# Patient Record
Sex: Male | Born: 1975 | Race: White | Hispanic: No | Marital: Married | Smoking: Never smoker
Health system: Southern US, Community
[De-identification: ages and names within clinical notes are randomized; demographics above are authoritative.]

## PROBLEM LIST (undated history)

## (undated) DIAGNOSIS — K760 Fatty (change of) liver, not elsewhere classified: Secondary | ICD-10-CM

## (undated) DIAGNOSIS — E663 Overweight: Secondary | ICD-10-CM

## (undated) DIAGNOSIS — F988 Other specified behavioral and emotional disorders with onset usually occurring in childhood and adolescence: Secondary | ICD-10-CM

## (undated) DIAGNOSIS — H9190 Unspecified hearing loss, unspecified ear: Secondary | ICD-10-CM

## (undated) HISTORY — DX: Other specified behavioral and emotional disorders with onset usually occurring in childhood and adolescence: F98.8

## (undated) HISTORY — DX: Unspecified hearing loss, unspecified ear: H91.90

## (undated) HISTORY — DX: Overweight: E66.3

## (undated) HISTORY — DX: Fatty (change of) liver, not elsewhere classified: K76.0

---

## 2006-01-01 ENCOUNTER — Ambulatory Visit: Payer: Self-pay | Admitting: Internal Medicine

## 2006-01-07 ENCOUNTER — Ambulatory Visit: Payer: Self-pay | Admitting: Internal Medicine

## 2006-02-07 ENCOUNTER — Ambulatory Visit: Payer: Self-pay | Admitting: Internal Medicine

## 2006-02-12 ENCOUNTER — Ambulatory Visit: Payer: Self-pay | Admitting: Internal Medicine

## 2006-12-15 ENCOUNTER — Ambulatory Visit: Payer: Self-pay | Admitting: Internal Medicine

## 2007-05-26 ENCOUNTER — Ambulatory Visit: Payer: Self-pay | Admitting: Internal Medicine

## 2007-11-12 HISTORY — PX: HAIR TRANSPLANT: SHX1719

## 2009-04-29 ENCOUNTER — Emergency Department (HOSPITAL_BASED_OUTPATIENT_CLINIC_OR_DEPARTMENT_OTHER): Admission: EM | Admit: 2009-04-29 | Discharge: 2009-04-29 | Payer: Self-pay | Admitting: Emergency Medicine

## 2010-05-28 ENCOUNTER — Encounter (INDEPENDENT_AMBULATORY_CARE_PROVIDER_SITE_OTHER): Payer: Self-pay | Admitting: *Deleted

## 2010-05-28 ENCOUNTER — Ambulatory Visit: Payer: Self-pay | Admitting: Family

## 2010-05-28 DIAGNOSIS — R197 Diarrhea, unspecified: Secondary | ICD-10-CM | POA: Insufficient documentation

## 2010-05-28 DIAGNOSIS — J301 Allergic rhinitis due to pollen: Secondary | ICD-10-CM | POA: Insufficient documentation

## 2010-05-28 DIAGNOSIS — S058X9A Other injuries of unspecified eye and orbit, initial encounter: Secondary | ICD-10-CM | POA: Insufficient documentation

## 2010-05-28 DIAGNOSIS — I10 Essential (primary) hypertension: Secondary | ICD-10-CM | POA: Insufficient documentation

## 2010-05-28 DIAGNOSIS — R61 Generalized hyperhidrosis: Secondary | ICD-10-CM | POA: Insufficient documentation

## 2010-05-29 ENCOUNTER — Encounter (INDEPENDENT_AMBULATORY_CARE_PROVIDER_SITE_OTHER): Payer: Self-pay | Admitting: *Deleted

## 2010-05-30 LAB — CONVERTED CEMR LAB
ALT: 51 units/L (ref 0–53)
BUN: 12 mg/dL (ref 6–23)
Basophils Relative: 1 % (ref 0–1)
Bilirubin, Direct: 0.1 mg/dL (ref 0.0–0.3)
Chloride: 102 meq/L (ref 96–112)
Creatinine, Ser: 0.9 mg/dL (ref 0.40–1.50)
HCT: 47.6 % (ref 39.0–52.0)
Hemoglobin: 16.1 g/dL (ref 13.0–17.0)
MCHC: 33.8 g/dL (ref 30.0–36.0)
MCV: 87.8 fL (ref 78.0–100.0)
Monocytes Absolute: 0.3 10*3/uL (ref 0.1–1.0)
Monocytes Relative: 5 % (ref 3–12)
Neutro Abs: 3.3 10*3/uL (ref 1.7–7.7)
RBC: 5.42 M/uL (ref 4.22–5.81)
TSH: 3.58 microintl units/mL (ref 0.350–4.500)
Total Protein: 7.8 g/dL (ref 6.0–8.3)

## 2010-05-31 ENCOUNTER — Encounter: Payer: Self-pay | Admitting: Family

## 2010-06-11 ENCOUNTER — Encounter: Payer: Self-pay | Admitting: Family

## 2010-07-13 ENCOUNTER — Ambulatory Visit: Payer: Self-pay | Admitting: Family

## 2010-07-13 DIAGNOSIS — E669 Obesity, unspecified: Secondary | ICD-10-CM | POA: Insufficient documentation

## 2010-07-13 LAB — CONVERTED CEMR LAB: HDL: 29 mg/dL — ABNORMAL LOW (ref >39)

## 2010-07-19 ENCOUNTER — Telehealth: Payer: Self-pay | Admitting: Family

## 2010-07-19 DIAGNOSIS — E781 Pure hyperglyceridemia: Secondary | ICD-10-CM | POA: Insufficient documentation

## 2010-08-10 ENCOUNTER — Telehealth: Payer: Self-pay | Admitting: Family

## 2010-08-10 ENCOUNTER — Ambulatory Visit: Payer: Self-pay | Admitting: Family

## 2010-08-10 DIAGNOSIS — F988 Other specified behavioral and emotional disorders with onset usually occurring in childhood and adolescence: Secondary | ICD-10-CM | POA: Insufficient documentation

## 2010-08-27 ENCOUNTER — Telehealth: Payer: Self-pay | Admitting: Family

## 2010-08-27 ENCOUNTER — Ambulatory Visit: Payer: Self-pay | Admitting: Family

## 2010-09-06 ENCOUNTER — Encounter: Payer: Self-pay | Admitting: Family

## 2010-09-06 ENCOUNTER — Ambulatory Visit: Payer: Self-pay | Admitting: Family

## 2010-09-07 ENCOUNTER — Telehealth: Payer: Self-pay | Admitting: Family

## 2010-09-10 ENCOUNTER — Encounter: Payer: Self-pay | Admitting: Family

## 2010-09-10 LAB — CONVERTED CEMR LAB
CO2: 26 meq/L (ref 19–32)
Chloride: 102 meq/L (ref 96–112)
Glucose, Bld: 103 mg/dL — ABNORMAL HIGH (ref 70–99)
Potassium: 4.6 meq/L (ref 3.5–5.3)
Sodium: 140 meq/L (ref 135–145)

## 2010-09-11 ENCOUNTER — Encounter: Payer: Self-pay | Admitting: Family

## 2010-11-26 ENCOUNTER — Ambulatory Visit: Admit: 2010-11-26 | Payer: Self-pay | Admitting: Family

## 2010-12-11 NOTE — Progress Notes (Signed)
Summary: BMP time frame  Phone Note Outgoing Call   Summary of Call: Please call patient and let him know that his BP looks better.  He should continue current dosage of lisinopril.  I would like for him to have a BMET checked in the next few weeks. (Diagnosis HTN)  I believe that his insurance is going to be running out so we can plan to check before then.  Also, if he completes his BMET then I do not need to see him back until january.  He can cancel december apt. thanks Initial call taken by: Lemont Fillers FNP,  September 07, 2010 3:29 PM  Follow-up for Phone Call        Pt states Monday, 10/31 is last day of insurance. Is that too soon to check BMP? Nicki Guadalajara Fergerson CMA Duncan Dull)  September 07, 2010 3:57 PM   Additional Follow-up for Phone Call Additional follow up Details #1::        Monday is fine. Additional Follow-up by: Lemont Fillers FNP,  September 07, 2010 4:08 PM    Additional Follow-up for Phone Call Additional follow up Details #2::    Left detailed message per [facility] instruction. Advised pt to call me back to cancel December appt and schedule f/u in January. Lab order placed and faxed to lab. Lab hours left for pt on voicemail. Nicki Guadalajara Fergerson CMA Duncan Dull)  September 07, 2010 4:48 PM

## 2010-12-11 NOTE — Letter (Signed)
Summary: Comprehensive Eye Exam/[facility]  Comprehensive Eye Exam/ [facility]   Imported By: Maryln Gottron 06/19/2010 10:48:13  _____________________________________________________________________  External Attachment:    Type:   Image     Comment:   External Document

## 2010-12-11 NOTE — Progress Notes (Signed)
Summary: lab results  Phone Note Outgoing Call   Summary of Call: Please call patient and let him know that his triglycerides are very high.  I would like him to add OTC fish oil and make the following dietary changes:   1.  Avoid white bread, white pasta and white rice 2.  Avoid high fructose corn syrup 3.  Instead eat brown carbs- Yams, Wheat pasta, whole grained breads, wild rice.  Return in 3 months for a follow up fasting lipid profile-  if no improvement at that time we may need to consider a prescription med for his triglycerides.  Initial call taken by: Lemont Fillers FNP,  July 19, 2010 2:41 PM  Follow-up for Phone Call        Left message on machine to return my call. Nicki Guadalajara Fergerson CMA Duncan Dull)  July 19, 2010 3:18 PM   Additional Follow-up for Phone Call Additional follow up Details #1::        Pt returned my call and was notified of lab results. Pt already has f/u on 10/19/10 and will do labs then. Pt is aware to be fasting. Nicki Guadalajara Fergerson CMA Duncan Dull)  July 19, 2010 3:43 PM   New Problems: HYPERTRIGLYCERIDEMIA (ICD-272.1)   New Problems: HYPERTRIGLYCERIDEMIA (ICD-272.1) New/Updated Medications: FISH OIL 1000 MG CAPS (OMEGA-3 FATTY ACIDS) 2 caps by mouth two times a day

## 2010-12-11 NOTE — Letter (Signed)
   [place] at [facility] [address] [place], [place]  [address]  [place] Phone: [phone]      September 11, 2010   [facility] [address] [place], [place] [address]  RE:  LAB RESULTS  Dear  Marcus Arias,  The following is an interpretation of your most recent lab tests.  Please take note of any instructions provided or changes to medications that have resulted from your lab work.  Hemoccult Test for blood in stool:  negative  ELECTROLYTES:  Good - no changes needed  KIDNEY FUNCTION TESTS:  Good - no changes needed   Your sugar was slightly elevated, not into a diabetic range. Please continue your weight loss and exercise efforts.  We will plan to repeat your glucose in 3-6 months.   Sincerely Yours,    Lemont Fillers FNP  Appended Document:  Mailed.

## 2010-12-11 NOTE — Assessment & Plan Note (Signed)
Summary: 2 MONTH FOLLOW UP/MHF--Rm 4   Vital Signs:  Patient profile:   35 year old male Height:      70.5 inches Weight:      247.25 pounds BMI:     35.10 Temp:     97.7 degrees F oral Pulse rate:   84 / minute Pulse rhythm:   regular Resp:     16 per minute BP sitting:   120 / 88  (right arm) Cuff size:   large  Vitals Entered By: Mervin Kung CMA Duncan Dull) (July 13, 2010 8:07 AM) CC: Room 4  2 month follow up.  Is Patient Diabetic? No Pain Assessment Patient in pain? yes     Location: abdomen Comments Pt thinks he ate something bad last night and is having some abdominal pain and nausea. Nicki Guadalajara Fergerson CMA (AAMA)  July 13, 2010 8:12 AM    CC:  Room 4  2 month follow up. Marland Kitchen  History of Present Illness: Mr Disney is a 35 year old male who presents today for follow up of his previously elevated blood pressure.  Last visit he was noted to have a BP 138/100.  Since that time he has changed his diet and has been incorporating more organic foods as well as watching his sodium.  His weight has remained essentially unchanged.    Diarrhea- Patient has cancelled his apt with GI- notes some improvement in the consistency of his BM's since he has made dietary changes.   No BRBPR.  Does note that he has had some mild nausea this AM which he attributes to food he ate last night, no vomitting.     Allergies (verified): No Known Drug Allergies  Physical Exam  General:  Overweight, pleasant white male in NAD Head:  Normocephalic and atraumatic without obvious abnormalities. No apparent alopecia or balding. Lungs:  Normal respiratory effort, chest expands symmetrically. Lungs are clear to auscultation, no crackles or wheezes. Heart:  Normal rate and regular rhythm. S1 and S2 normal without gallop, murmur, click, rub or other extra sounds. Abdomen:  Bowel sounds positive,abdomen soft and non-tender without masses, organomegaly or hernias noted.   Impression &  Recommendations:  Problem # 1:  HYPERTENSION, MILD (ICD-401.1) Assessment Improved BP is improved,  Will continue to monitor off meds, continue low sodium diet.   BP today: 120/88 Prior BP: 138/100 (05/28/2010)  Labs Reviewed: K+: 4.4 (05/28/2010) Creat: : 0.90 (05/28/2010)     Problem # 2:  DIARRHEA, CHRONIC (ICD-787.91) Assessment: Improved Declines GI referral at this time.  Will monitor.    Complete Medication List: 1)  Claritin 10 Mg Tabs (Loratadine) .... One tablet by mouth daily as needed  Other Orders: TLB-Lipid Panel (80061-LIPID)  Patient Instructions: 1)  Try to keep your calories to 1800- 2000 a day.   2)  Good job with with the blood pressure.  3)  Goal blood pressure is less than 140/90. 4)  Try to exercise at least 3 times a week. 5)  Follow up in 3 months.  Sooner if problems or concerns.  Current Allergies (reviewed today): No known allergies

## 2010-12-11 NOTE — Assessment & Plan Note (Signed)
Summary: nurse visit  Nurse Visit   Vital Signs:  Patient profile:   35 year old male Pulse rate:   90 / minute Pulse rhythm:   regular Resp:     16 per minute BP sitting:   120 / 90  (right arm) Cuff size:   large  Vitals Entered By: Mervin Kung CMA Duncan Dull) (September 06, 2010 8:38 AM) CC: Pt here for nurse visit for blood pressure check. Pt states he is taking BP med daily. Has appointments with [place] in December and January. Does he need to cancel one of these? Is Patient Diabetic? No Pain Assessment Patient in pain? no        Impression & Recommendations:  Problem # 1:  HYPERTENSION, MILD (ICD-401.1) Assessment Improved BP is improved.  Plan to continue lisinopril.  Pt will need a follow up BMET performed.  His updated medication list for this problem includes:    Lisinopril 10 Mg Tabs (Lisinopril) ..... One tablet by mouth once daily  BP today: 120/90 Prior BP: 130/100 (08/27/2010)  Labs Reviewed: K+: 4.4 (05/28/2010) Creat: : 0.90 (05/28/2010)   Chol: 208 (07/13/2010)   HDL: 29 (07/13/2010)   LDL: See Comment mg/dL (08/65/7846)   TG: 962 (07/13/2010)  Complete Medication List: 1)  Claritin 10 Mg Tabs (Loratadine) .... One tablet by mouth daily as needed 2)  Fish Oil 1000 Mg Caps (Omega-3 fatty acids) .... 2 caps by mouth two times a day 3)  Advil 200 Mg Tabs (Ibuprofen) .... As needed. 4)  Strattera 40 Mg Caps (Atomoxetine hcl) .... One cap by mouth once daily 5)  Lisinopril 10 Mg Tabs (Lisinopril) .... One tablet by mouth once daily   Allergies (verified): No Known Drug Allergies  Orders Added: 1)  Est. Patient Level I [95284]  Current Allergies (reviewed today): No known allergies

## 2010-12-11 NOTE — Assessment & Plan Note (Signed)
Summary: add management/mhf--Rm 5   Vital Signs:  Patient profile:   35 year old male Height:      70.5 inches Weight:      252.50 pounds BMI:     35.85 Temp:     98.0 degrees F oral Pulse rate:   96 / minute Pulse rhythm:   regular Resp:     16 per minute BP sitting:   126 / 86  (right arm) Cuff size:   large  Vitals Entered By: Mervin Kung CMA Duncan Dull) (August 10, 2010 9:50 AM) CC: rm 5  Pt states he has history of ADD and would like to go back on meds. Is Patient Diabetic? No Pain Assessment Patient in pain? no        CC:  rm 5  Pt states he has history of ADD and would like to go back on meds..  History of Present Illness: Marcus Arias is a 35 year old male who presents today with complaint of difficulty focusing,  having trouble paying attention in class if there are also  "side conversations."  Recently enrolled in a Bachelors program.  Notes that his mind wanders when he studies.  Diagnosed with ADD in elementary school- he took Ridilin at that time. In his 20's he was treated with Strattera. While on Strattera he believes that he was better able to focus on conversation.  He was previously being treated by a Family physician. He does not believe that he underwent formal evaluation for his ADD.    Allergies (verified): No Known Drug Allergies  Past History:  Past Medical History: overweight Notes hearing issues as a baby- ? tubes as baby.  ADD  Review of Systems       patient notes resolution of his diarrhea since he started an organic diet.  Physical Exam  General:  Well-developed,well-nourished,in no acute distress; alert,appropriate and cooperative throughout examination Psych:  Cognition and judgment appear intact. Alert and cooperative with normal attention span and concentration. No apparent delusions, illusions, hallucinations   Impression & Recommendations:  Problem # 1:  ADD (ICD-314.00) Assessment Deteriorated Patient wishes to resume  Strattera.  I did recommend to the patient that he undergo a formal evaluation for ADD at [facility] which he ultimately has declined to do.  Will plan to resume strattera and have him follow up in 1 month for BP check.  15 minutes were spent with patient.  Greater than 50% of this time was spent counseling patient on his ADD.  Complete Medication List: 1)  Claritin 10 Mg Tabs (Loratadine) .... One tablet by mouth daily as needed 2)  Fish Oil 1000 Mg Caps (Omega-3 fatty acids) .... 2 caps by mouth two times a day 3)  Advil 200 Mg Tabs (Ibuprofen) .... As needed. 4)  Strattera 80 Mg Caps (Atomoxetine hcl) .... 1/2 tablet by mouth once daily for 3 days, then increase to one full tablet once daily  Patient Instructions: 1)  You will be called about your referral to [facility]. 2)  Follow up 12/9 at 8 AM.  Current Allergies (reviewed today): No known allergies

## 2010-12-11 NOTE — Progress Notes (Signed)
Summary: Generic ADD med?  Phone Note Call from Patient Call back at [phone] ok to leave detailed message.   Caller: Patient Call For: Lemont Fillers FNP Summary of Call: Pt called and asked to be placed on a generic med for ADD so he will be able to afford it. Requested generic Ritalin. Please advise. Initial call taken by: Mervin Kung CMA Duncan Dull),  August 27, 2010 12:17 PM  Follow-up for Phone Call        Left message on patient's voicemail re:  generic adderall.  His blood pressure will need to be better controlled before we can consider switching his medication.  If we do switch him down the road, he will need to be evaluated more frequently for BP checks. Follow-up by: Lemont Fillers FNP,  August 27, 2010 1:43 PM     Appended Document: Generic ADD med? Spoke to [facility] and changed quantity to #90 x no refills, ok per Melissa. Pt aware.

## 2010-12-11 NOTE — Progress Notes (Signed)
Summary: Refusal of referral  Phone Note Call from Patient Call back at [phone]   Caller: Patient Details for Reason: Referral Summary of Call: Please call pt    [phone]    "I don't want to be referred to any doctor,before talking to [facility]"   Initial call taken by: Darral Dash,  August 10, 2010 11:00 AM  Follow-up for Phone Call        Pt left voice message stating that [facility] is non-preferred with his insurance and he doesn't want to be referred to anyone for ADD evaluation. He feels this is a waste of his time and money as he was diagnosed with this in the past and feels he just needs to go back on his medication Blase Mess).  Pt feels like he doesn't need an evaluation to be told what he already knows.  Please advise.  Nicki Guadalajara Fergerson CMA Duncan Dull)  August 10, 2010 12:09 PM   Additional Follow-up for Phone Call Additional follow up Details #1::        Rx sent to pharmacy.  Pt should call if he develops symptoms or depression or suicidal thoughts after starting this medication.  He should follow up in 1 month so that we can see how he is doing and repeat his blood pressure on this medication. Additional Follow-up by: Lemont Fillers FNP,  August 10, 2010 12:16 PM    Additional Follow-up for Phone Call Additional follow up Details #2::    Left message on machine to return my call. Nicki Guadalajara Fergerson CMA Duncan Dull)  August 10, 2010 2:04 PM   Pt left message returning my call. Attempted to reach pt and received voice mail. Left detailed message on phone re: Alleene Stoy's instructions and to call and scheduled an appt in 1 month.   Nicki Guadalajara Fergerson CMA Duncan Dull)  August 13, 2010 9:11 AM   New/Updated Medications: STRATTERA 80 MG CAPS (ATOMOXETINE HCL) 1/2 tablet by mouth once daily for 3 days, then increase to one full tablet once daily Prescriptions: STRATTERA 80 MG CAPS (ATOMOXETINE HCL) 1/2 tablet by mouth once daily for 3 days, then increase to one full tablet once daily   #30 x 1   Entered and Authorized by:   Lemont Fillers FNP   Signed by:   Lemont Fillers FNP on 08/10/2010   Method used:   Electronically to        [facility]* (retail)       [address]       [place], [place]  [id number]       Ph: [phone]       Fax: [phone]   RxID:   [id number]   Appended Document: Refusal of referral Pt returned my call. Instructions given to pt and he voice understanding. Scheduled f/u for 09/12/10 @ 8:15 with Keva Darty.

## 2010-12-11 NOTE — Assessment & Plan Note (Signed)
Summary: 1 month f/u /tf,cma rsch per pt/dt--Rm 5   Vital Signs:  Patient profile:   35 year old male Height:      70.5 inches Weight:      249.25 pounds BMI:     35.39 Temp:     98.0 degrees F oral Pulse rate:   90 / minute Pulse rhythm:   regular Resp:     16 per minute BP sitting:   130 / 100  (right arm) Cuff size:   large  Vitals Entered By: Mervin Kung CMA Duncan Dull) (August 27, 2010 10:50 AM) CC: Rm 5  Follow up of ADD. Is Patient Diabetic? No Pain Assessment Patient in pain? no      Comments Pt agrees all med doses and directions are correct. Nicki Guadalajara Fergerson CMA Duncan Dull)  August 27, 2010 10:55 AM    CC:  Rm 5  Follow up of ADD.Marland Kitchen  History of Present Illness: Marcus Arias is a 35 year old male who presents today for follow up.    1. ADD.  Last month he was started back on Strattera, which he had been off of for many years.  Notes that he is better able to focus since starting the strattera.  He notes that he was too somnolent on Strattera 80, he is better able to tolerate strattera 40 and feels that this dose is effective for him.      2. High blood pressure- notes that he has restarted an exercise routine which he is enjoying.  Has not complied completely with low sodium diet. He has lost several pounds since his last visit.  Unfortunately, the patient has recently lost his job and is scheduled to lose his health insurance at the end of the month.   Allergies (verified): No Known Drug Allergies  Past History:  Past Medical History: Last updated: 08/10/2010 overweight Notes hearing issues as a baby- ? tubes as baby.  ADD  Past Surgical History: Last updated: 05/28/2010 Hair transplantation procedure 2009  Physical Exam  General:  Well-developed,well-nourished,in no acute distress; alert,appropriate and cooperative throughout examination Psych:  Cognition and judgment appear intact. Alert and cooperative with normal attention span and concentration. No  apparent delusions, illusions, hallucinations   Impression & Recommendations:  Problem # 1:  ADD (ICD-314.00) Assessment Improved Symptoms are improved, continue strattera at 40mg .  15 minutes were spent with patient.  Greater than 50% of this time was spent counseling the patient on his ADD.  Problem # 2:  HYPERTENSION, MILD (ICD-401.1) Assessment: Deteriorated Will add lisinopril  Pt was instructed to book a nurse visit in 1 week for BP check.  We will also plan to check his electrolytes at that visit.  His updated medication list for this problem includes:    Lisinopril 10 Mg Tabs (Lisinopril) ..... One tablet by mouth once daily  BP today: 130/100 Prior BP: 126/86 (08/10/2010)  Labs Reviewed: K+: 4.4 (05/28/2010) Creat: : 0.90 (05/28/2010)   Chol: 208 (07/13/2010)   HDL: 29 (07/13/2010)   LDL: See Comment mg/dL (29/56/2130)   TG: 865 (07/13/2010)  Complete Medication List: 1)  Claritin 10 Mg Tabs (Loratadine) .... One tablet by mouth daily as needed 2)  Fish Oil 1000 Mg Caps (Omega-3 fatty acids) .... 2 caps by mouth two times a day 3)  Advil 200 Mg Tabs (Ibuprofen) .... As needed. 4)  Strattera 40 Mg Caps (Atomoxetine hcl) .... One cap by mouth once daily 5)  Lisinopril 10 Mg Tabs (Lisinopril) .... One tablet  by mouth once daily  Patient Instructions: 1)  Please schedule a follow-up appointment in 3 months- come fasting to this appointment. 2)  Follow up in 1 week for lab work and a nurse visit. Prescriptions: LISINOPRIL 10 MG TABS (LISINOPRIL) one tablet by mouth once daily  #30 x 2   Entered and Authorized by:   Lemont Fillers FNP   Signed by:   Lemont Fillers FNP on 08/27/2010   Method used:   Electronically to        [facility] [phone]* (retail)       [address] [place], [place]  [address]       Ph: [phone]       Fax: [phone]   RxID:   [id number] STRATTERA 40 MG CAPS (ATOMOXETINE HCL) one cap by mouth once daily  #30 x 2    Entered and Authorized by:   Lemont Fillers FNP   Signed by:   Lemont Fillers FNP on 08/27/2010   Method used:   Electronically to        [facility]* (retail)       [address]       [place], [place]  [id number]       Ph: [phone]       Fax: [phone]   RxID:   [id number]    Orders Added: 1)  Est. Patient Level III [[address]]    Current Allergies (reviewed today): No known allergies

## 2010-12-11 NOTE — Letter (Signed)
Summary: Primary Care Consult Scheduled Letter  [place] [facility]  [address]   [place], [place] [address]   Phone: [phone]  Fax: [phone]      05/29/2010 MRN: [id number]  [facility] [address] [place], [place]  [address]    Dear Mr. Gerwig,      We have scheduled an appointment for you.  At the recommendation of MELISSA O'SULLIVAN, FNP , we have scheduled you a consult with [facility]O, DR LONG on AUGUST 15,2011 at 1:30PM.  Their address is_[address],[place]  . The office phone number is [phone].  If this appointment day and time is not convenient for you, please feel free to call the office of the doctor you are being referred to at the number listed above and reschedule the appointment.     It is important for you to keep your scheduled appointments. We are here to make sure you are given good patient care.     Thank you,  Darral Dash Patient Care Coordinator [place] [facility]

## 2010-12-11 NOTE — Letter (Signed)
   [place] at [facility] [address] [place], [place]  [address]  [place] Phone: [phone]      May 31, 2010   Marcus Arias [address] [place], [place] [address]  RE:  LAB RESULTS  Dear  Marcus Arias,  The following is an interpretation of your most recent lab tests.  Please take note of any instructions provided or changes to medications that have resulted from your lab work.  ELECTROLYTES:  Good - no changes needed  KIDNEY FUNCTION TESTS:  Good - no changes needed  LIVER FUNCTION TESTS:  Good - no changes needed  THYROID STUDIES:  Thyroid studies normal TSH: 3.580     DIABETIC STUDIES:  Good - no changes needed Blood Glucose: 95   HIV testing is negative.   Sincerely Yours,    Lemont Fillers FNP  Appended Document:  Mailed.

## 2010-12-11 NOTE — Letter (Signed)
Summary: Primary Care Consult Scheduled Letter  [place] at [facility]  [address]   [place], [place] [address]   Phone: [phone]  Fax: [phone]      05/28/2010 MRN: [id number]  Marcus Arias [facility] [address] [place], [place]  [address]    Dear Marcus Arias,      We have scheduled an appointment for you.  At the recommendation of MELISSA O'SULLIVAN, FNP, we have scheduled you a consult with [facility]  DR Jeanella Anton  on _JULY 22 ,2011 at _2PM.  Their address is__[address], [place] . The office phone number is [phone].  If this appointment day and time is not convenient for you, please feel free to call the office of the doctor you are being referred to at the number listed above and reschedule the appointment.     It is important for you to keep your scheduled appointments. We are here to make sure you are given good patient care.   Thank you,  Darral Dash Patient Care Coordinator  at [facility]

## 2010-12-11 NOTE — Assessment & Plan Note (Signed)
Summary: TO RE-EST  Marcus Arias HAS WAX IN EAR  /HEA--rm 5   Vital Signs:  Patient profile:   35 year old male Height:      70.5 inches Weight:      247.50 pounds BMI:     35.14 Temp:     97.5 degrees F oral Pulse rate:   90 / minute Pulse rhythm:   regular Resp:     18 per minute BP sitting:   138 / 100  (right arm) Cuff size:   large  Vitals Entered By: Mervin Kung CMA Duncan Dull) (May 28, 2010 11:33 AM) CC: rOOM 5   Pt wants physical. Has wax build up in left ear. Left eye blurry x  1 year ago after scratch on pt's cornea. Having night sweats.   CC:  rOOM 5   Pt wants physical. Has wax build up in left ear. Left eye blurry x  1 year ago after scratch on pt's cornea. Having night sweats..  History of Present Illness: Mr Marcus Arias is a 35 year old male who presents today to re-establish care.  He would like to have a complete physical and also has several concerns that he would like to address.   1) Corneal Scratch-Notes corneal scratch 1 year ago- got a particle in his eye due to wind.  Saw MD at [facility].  Since that time he feels that he is not focusing as well out of his left eye.  2)Night sweats- x 6 months.  Denies known fevers.  Denies cough.    3) Chronic Diarrhea-  notes that this started about 1 year ago.  He also reports + hemorrhoid.  Notes that he had some blood noted on tissue 2-3 months ago.  Patient reports that he has 1-2 loose stools a day.    4) L. ear pain-  notes that left ear sounds muffled.  He saw audiologist and was told to return after wax was removed.  5) High blood pressure-  Has never been on BP meds.    6) Preventative care- reports + regular exercise.  Wife is pre-diabetic.  They have been eating much healthier.  He believes that his last tetanus was in 2008.  Past History:  Past Medical History: overweight Notes hearing issues as a baby- ? tubes as baby.   Past Surgical History: Hair transplantation procedure 2009  Family History: Mom- living,  alive and well Dad- skin cancer, fibromyalgia, OA, chronic fatigue brother- history of "blood in stool" sister-alive and well No children  Social History: works as an Film/video editor Married No children Never smoked rare social ETOH Denies drug use + exercise- swimming 3x a week.  Review of Systems       Constitutional: Denies Fever ENT:  + nasal congestion due to allergies or sore throat. Resp: Denies cough CV:  Denies Chest Pain or SOB GI:  Denies nausea or vomitting, + diarrhea, denies melena GU: Denies dysuria Lymphatic: Denies lymphadenopathy Musculoskeletal:  Denies muscle/joint pain Skin:  Denies Rashes Psychiatric: history of depression- situational depression. Notes + OCD behavior- does not interfere with day to day activities.   Neuro: Denies numbness      Physical Exam  General:  Overweight white male in NAd Head:  Normocephalic and atraumatic without obvious abnormalities. No apparent alopecia or balding. Eyes:  PERRLA Ears:  Noted to have mild serous effusions bilaterally. Hearing is grossly normal bilaterally. Mouth:  Oral mucosa and oropharynx without lesions or exudates.  Teeth in good  repair. Neck:  No deformities, masses, or tenderness noted. Chest Wall:  No deformities, masses, tenderness or gynecomastia noted. Lungs:  Normal respiratory effort, chest expands symmetrically. Lungs are clear to auscultation, no crackles or wheezes. Heart:  Normal rate and regular rhythm. S1 and S2 normal without gallop, murmur, click, rub or other extra sounds. Abdomen:  Bowel sounds positive,abdomen soft and non-tender without masses, organomegaly or hernias noted. Rectal:  No external abnormalities noted. Normal sphincter tone. No rectal masses or tenderness. Heme negative Genitalia:  Testes bilaterally descended without nodularity, tenderness or masses. No scrotal masses or lesions. No penis lesions or urethral discharge. Prostate:  Prostate gland firm  and smooth, no enlargement, nodularity, tenderness, mass, asymmetry or induration. Msk:  No deformity or scoliosis noted of thoracic or lumbar spine.   Extremities:  No clubbing, cyanosis, edema, or deformity noted with normal full range of motion of all joints.   Neurologic:  No cranial nerve deficits noted. Station and gait are normal. Plantar reflexes are down-going bilaterally. DTRs are symmetrical throughout. Sensory, motor and coordinative functions appear intact. Skin:  Intact without suspicious lesions or rashes Cervical Nodes:  No lymphadenopathy noted Psych:  Cognition and judgment appear intact. Alert and cooperative with normal attention span and concentration. No apparent delusions, illusions, hallucinations   Impression & Recommendations:  Problem # 1:  Preventive Health Care (ICD-V70.0) Assessment Comment Only Pt believes that he had tetanus in 2008 at a family practice center.  Exercises regularly.  Encouraged to continue his healthy diet, exercise and weight loss efforts.  Will check baseline labs.  Problem # 2:  HYPERTENSION, MILD (ICD-401.1) Assessment: New Pt counseled on low sodium diet.  Plan to repeat in 1 month.  If still elevated will plan to initiate meds.  Problem # 3:  NIGHT SWEATS (ICD-780.8) Assessment: New Check TSH, CBC,  will also check HIV screen  Problem # 4:  DIARRHEA, CHRONIC (ICD-787.91) Assessment: Deteriorated  ? IBS, has been ongoing for 1 year.  Will plan referral to GI.    Orders: Gastroenterology Referral (GI)  Problem # 5:  ALLERGIC RHINITIS, SEASONAL (ICD-477.0) Will add claritin once a day, may also help with ear congestion.  No clincal sign of ear infection  Problem # 6:  SUPERFICIAL INJURY OF CORNEA (ICD-918.1) Assessment: Comment Only Will plan referral to opthalmology for further evaluation. Orders: Ophthalmology Referral (Ophthalmology)  Complete Medication List: 1)  Claritin 10 Mg Tabs (Loratadine) .... One tablet by mouth  daily  Other Orders: TLB-BMP (Basic Metabolic Panel-BMET) (80048-METABOL) TLB-CBC Platelet - w/Differential (85025-CBCD) TLB-Hepatic/Liver Function Pnl (80076-HEPATIC) TLB-TSH (Thyroid Stimulating Hormone) (84443-TSH) T-HIV-1 (Screen) (08657)  Patient Instructions: 1)  Please complete your labs downstairs today. 2)  Follow up in 1 month. 3)  You will be contacted about your referrals to GI and Opthalmology.

## 2011-03-05 ENCOUNTER — Encounter: Payer: Self-pay | Admitting: Family

## 2011-03-06 ENCOUNTER — Ambulatory Visit (INDEPENDENT_AMBULATORY_CARE_PROVIDER_SITE_OTHER): Payer: 59 | Admitting: Family

## 2011-03-06 ENCOUNTER — Encounter: Payer: Self-pay | Admitting: Family

## 2011-03-06 VITALS — BP 120/90 | HR 78 | Temp 98.0°F | Resp 16 | Ht 70.51 in | Wt 237.1 lb

## 2011-03-06 DIAGNOSIS — F988 Other specified behavioral and emotional disorders with onset usually occurring in childhood and adolescence: Secondary | ICD-10-CM

## 2011-03-06 DIAGNOSIS — I10 Essential (primary) hypertension: Secondary | ICD-10-CM

## 2011-03-06 DIAGNOSIS — M25519 Pain in unspecified shoulder: Secondary | ICD-10-CM

## 2011-03-06 DIAGNOSIS — F909 Attention-deficit hyperactivity disorder, unspecified type: Secondary | ICD-10-CM

## 2011-03-06 DIAGNOSIS — M25511 Pain in right shoulder: Secondary | ICD-10-CM

## 2011-03-06 DIAGNOSIS — S46919A Strain of unspecified muscle, fascia and tendon at shoulder and upper arm level, unspecified arm, initial encounter: Secondary | ICD-10-CM

## 2011-03-06 DIAGNOSIS — IMO0002 Reserved for concepts with insufficient information to code with codable children: Secondary | ICD-10-CM

## 2011-03-06 MED ORDER — MELOXICAM 7.5 MG PO TABS: 7.5000 mg | ORAL_TABLET | Freq: Every day | ORAL | Status: DC

## 2011-03-06 MED ORDER — ATOMOXETINE HCL 60 MG PO CAPS: 60.0000 mg | ORAL_CAPSULE | Freq: Every day | ORAL | Status: DC

## 2011-03-06 NOTE — Assessment & Plan Note (Signed)
Trial of Mobic.  If no improvement  In 1 month- plan referral to Dr. Pearletha Forge.

## 2011-03-06 NOTE — Progress Notes (Signed)
  Subjective:    Patient ID: Marcus Arias, male    DOB: 21-May-1976, 35 y.o.   MRN: [id number]  HPI Mr  ADD-  Taking strattera 40mg - he felt that he was focusing better at work.  Stopped a few weeks ago. Though not able to focus as   New job- working in Music therapist.  Now has insurance again.  HTN- stopped lisinopril- "made my blood pressure too low."  R shoulder pain- notes 3-4 yrs ago he had an injury .  Notes that this occurred 3-4 years ago due to bench pressing too much.  Pain with pressure or with laying on the right shoulder.  Notes that he landed on his right shoulder while dancing a few months ago.  Felt sore and bruised.  He has not taken any OTC meds.     Review of Systems See HPI  Past Medical History  Diagnosis Date  . ADD (attention deficit disorder)   . Hearing problem     as an infant.  ??tubes  . Overweight     History   Social History  . Marital Status: Married    Spouse Name: N/A    Number of Children: 0  . Years of Education: N/A   Occupational History  . IT assistant    Social History Main Topics  . Smoking status: Never Smoker   . Smokeless tobacco: Not on file  . Alcohol Use: Yes     rare  . Drug Use: No  . Sexually Active: Not on file   Other Topics Concern  . Not on file   Social History Narrative   Exercise: swimming 3 x weeklyAssociates Degree    Past Surgical History  Procedure Date  . Hair transplant 2009    Family History  Problem Relation Age of Onset  . Cancer Father     skin  . Fibromyalgia Father   . Chronic fatigue Father   . Arthritis Father     osteoarthritis    Allergies  Allergen Reactions  . Penicillins     Allergy not confirmed. Pt's Dad is allergic to PCN.    Current Outpatient Prescriptions on File Prior to Visit  Medication Sig Dispense Refill  . loratadine (CLARITIN) 10 MG tablet Take 10 mg by mouth daily as needed.        . Omega-3 Fatty Acids (FISH OIL) 1000 MG CAPS Take 2 capsules by mouth 2  (two) times daily.        Marland Kitchen DISCONTD: atomoxetine (STRATTERA) 40 MG capsule Take 40 mg by mouth daily.        Marland Kitchen DISCONTD: ibuprofen (ADVIL) 200 MG tablet Take 200 mg by mouth as needed.        Marland Kitchen DISCONTD: lisinopril (PRINIVIL,ZESTRIL) 10 MG tablet Take 10 mg by mouth daily.          BP 120/90  Pulse 78  Temp(Src) 98 F (36.7 C) (Oral)  Resp 16  Ht 5' 10.51" (1.791 m)  Wt 237 lb 1.9 oz (107.557 kg)  BMI 33.53 kg/m2       Objective:   Physical Exam  Constitutional: He appears well-developed and well-nourished.  Cardiovascular: Normal rate and regular rhythm.   Pulmonary/Chest: Effort normal and breath sounds normal.  Musculoskeletal:       Full ROM of right shoulder.  No swelling.  + tenderness to palpation.           Assessment & Plan:

## 2011-03-06 NOTE — Assessment & Plan Note (Signed)
Pt has lost 12 pounds  BP- OK today of lisinopril.  Monitor carefully with reinitiation of Strattera

## 2011-03-06 NOTE — Assessment & Plan Note (Signed)
Will increase stattera from 40 to 60 mg.  He has been somnolent on the 80 mg dose.

## 2011-04-05 ENCOUNTER — Ambulatory Visit (INDEPENDENT_AMBULATORY_CARE_PROVIDER_SITE_OTHER): Payer: 59 | Admitting: Family

## 2011-04-05 ENCOUNTER — Encounter: Payer: Self-pay | Admitting: Family

## 2011-04-05 VITALS — BP 120/90 | HR 78 | Temp 97.8°F | Resp 18 | Ht 70.5 in | Wt 238.1 lb

## 2011-04-05 DIAGNOSIS — M25511 Pain in right shoulder: Secondary | ICD-10-CM

## 2011-04-05 DIAGNOSIS — I10 Essential (primary) hypertension: Secondary | ICD-10-CM

## 2011-04-05 DIAGNOSIS — F988 Other specified behavioral and emotional disorders with onset usually occurring in childhood and adolescence: Secondary | ICD-10-CM

## 2011-04-05 DIAGNOSIS — F909 Attention-deficit hyperactivity disorder, unspecified type: Secondary | ICD-10-CM

## 2011-04-05 MED ORDER — ATOMOXETINE HCL 60 MG PO CAPS: 60.0000 mg | ORAL_CAPSULE | Freq: Every day | ORAL | Status: DC

## 2011-04-05 NOTE — Assessment & Plan Note (Addendum)
BP Readings from Last 3 Encounters:  04/05/11 120/90  03/06/11 120/90  09/06/10 120/90   BP stable, continue to monitor.

## 2011-04-05 NOTE — Assessment & Plan Note (Signed)
Resolved

## 2011-04-05 NOTE — Patient Instructions (Addendum)
Keep up the good work with diet and exercise  

## 2011-04-05 NOTE — Progress Notes (Signed)
  Subjective:    Patient ID: Marcus Arias, male    DOB: Apr 18, 1976, 35 y.o.   MRN: [id number]  HPI  ADD- now on 60mg  of Strattera- feels more effective at his job, better planning and organizing his time.  Not sleepy on this dose.  R shoulder pain- reports that this has improved with use of mobic- pain is resolved.    Sore throat- notes that his wife was sick last week with hoarseness.  Review of Systems See HPI  Past Medical History  Diagnosis Date  . ADD (attention deficit disorder)   . Hearing problem     as an infant.  ??tubes  . Overweight     History   Social History  . Marital Status: Married    Spouse Name: N/A    Number of Children: 0  . Years of Education: N/A   Occupational History  . IT assistant    Social History Main Topics  . Smoking status: Never Smoker   . Smokeless tobacco: Not on file  . Alcohol Use: Yes     rare  . Drug Use: No  . Sexually Active: Not on file   Other Topics Concern  . Not on file   Social History Narrative   Exercise: swimming 3 x weeklyAssociates Degree    Past Surgical History  Procedure Date  . Hair transplant 2009    Family History  Problem Relation Age of Onset  . Cancer Father     skin  . Fibromyalgia Father   . Chronic fatigue Father   . Arthritis Father     osteoarthritis    Allergies  Allergen Reactions  . Penicillins     Allergy not confirmed. Pt's Dad is allergic to PCN.    Current Outpatient Prescriptions on File Prior to Visit  Medication Sig Dispense Refill  . atomoxetine (STRATTERA) 60 MG capsule Take 1 capsule (60 mg total) by mouth daily.  30 capsule  2  . loratadine (CLARITIN) 10 MG tablet Take 10 mg by mouth daily as needed.        . meloxicam (MOBIC) 7.5 MG tablet Take 1 tablet (7.5 mg total) by mouth daily.  30 tablet  0  . Omega-3 Fatty Acids (FISH OIL) 1000 MG CAPS Take 2 capsules by mouth 2 (two) times daily.          BP 120/90  Pulse 78  Temp(Src) 97.8 F (36.6 C) (Oral)   Resp 18  Ht 5' 10.5" (1.791 m)  Wt 238 lb 1.9 oz (108.011 kg)  BMI 33.68 kg/m2       Objective:   Physical Exam  Constitutional: He is oriented to person, place, and time. He appears well-developed and well-nourished.  Cardiovascular: Normal rate and regular rhythm.   Neurological: He is oriented to person, place, and time.  Psychiatric: He has a normal mood and affect. His behavior is normal.          Assessment & Plan:

## 2011-04-05 NOTE — Assessment & Plan Note (Signed)
Improved on current dose of strattera. Continue same. F/u in 3 months so that we can keep an eye on BP.

## 2011-06-24 ENCOUNTER — Telehealth: Payer: Self-pay | Admitting: Family

## 2011-06-24 MED ORDER — ATOMOXETINE HCL 40 MG PO CAPS: 40.0000 mg | ORAL_CAPSULE | Freq: Every day | ORAL | Status: DC

## 2011-06-24 NOTE — Telephone Encounter (Signed)
Patients feels like he needs a lower dosage of strattera. Every time he takes meds(with food) he gets an upset stomach.

## 2011-06-24 NOTE — Telephone Encounter (Signed)
Call placed to patient at [phone], he was informed per Sandford Craze instruction and has verbalized understanding.

## 2011-06-24 NOTE — Telephone Encounter (Signed)
I will decrease Strattera to 40mg  once daily.  Please remind him to keep upcoming apt on 8/24.

## 2011-07-05 ENCOUNTER — Telehealth: Payer: Self-pay | Admitting: *Deleted

## 2011-07-05 ENCOUNTER — Encounter: Payer: Self-pay | Admitting: Family

## 2011-07-05 ENCOUNTER — Ambulatory Visit (INDEPENDENT_AMBULATORY_CARE_PROVIDER_SITE_OTHER): Payer: 59 | Admitting: Family

## 2011-07-05 DIAGNOSIS — N529 Male erectile dysfunction, unspecified: Secondary | ICD-10-CM

## 2011-07-05 DIAGNOSIS — I10 Essential (primary) hypertension: Secondary | ICD-10-CM

## 2011-07-05 DIAGNOSIS — F988 Other specified behavioral and emotional disorders with onset usually occurring in childhood and adolescence: Secondary | ICD-10-CM

## 2011-07-05 MED ORDER — TADALAFIL 5 MG PO TABS: 5.0000 mg | ORAL_TABLET | Freq: Every day | ORAL | Status: DC | PRN

## 2011-07-05 NOTE — Telephone Encounter (Signed)
Received message from pt that Cialis Rx will require PA. Spoke to [place] at [phone] and initiated prior auth. Reference # [id number]. Form will be faxed.

## 2011-07-05 NOTE — Patient Instructions (Signed)
Work hard on a low sodium diet, exercise and weight loss. Follow up in 2 months, sooner if problems or concerns.

## 2011-07-05 NOTE — Assessment & Plan Note (Signed)
BP remains borderline.  Will repeat next visit. We did discuss importance of continued weight loss, low sodium diet, exercise.  If still elevated will need to add back in an antihypertensive agent.

## 2011-07-05 NOTE — Assessment & Plan Note (Signed)
Will give trial on Cialis once a day to see if this helps.  Consider checking testosterone level next visit.

## 2011-07-05 NOTE — Assessment & Plan Note (Signed)
Stable, but he is intolerant to 60mg  dose of stattera due to nausea.  Will cut him down to 40mg  once daily and see how he does at this dose.

## 2011-07-05 NOTE — Progress Notes (Signed)
Subjective:    Patient ID: Marcus Arias, male    DOB: 1976/02/02, 35 y.o.   MRN: [id number]  HPI  [name] is a 35 yr old male who presents today for follow up.   1) ADD-  Notes that if he takes stattera 60mg  daily he develops nausea. He is using the remainder of the 60 mg on an every other day basis which he is toelrating.  Feels that his ADD is well controlled.  Recently he has started a Gluten Free diet and feels that his ADD symptoms are improved.    2) ED- Reports that he has struggled with ED for about 7 years.  Reports that he can achieve an erection but has trouble maintaining.  This causes him some associated anxiety which he feels worsens the situation.  Report an otherwise healthy libido.  3) HTN- pt is currently off of BP meds.    Review of Systems See HPI  Past Medical History  Diagnosis Date  . ADD (attention deficit disorder)   . Hearing problem     as an infant.  ??tubes  . Overweight     History   Social History  . Marital Status: Married    Spouse Name: N/A    Number of Children: 0  . Years of Education: N/A   Occupational History  . IT assistant    Social History Main Topics  . Smoking status: Never Smoker   . Smokeless tobacco: Not on file  . Alcohol Use: Yes     rare  . Drug Use: No  . Sexually Active: Not on file   Other Topics Concern  . Not on file   Social History Narrative   Exercise: swimming 3 x weeklyAssociates Degree    Past Surgical History  Procedure Date  . Hair transplant 2009    Family History  Problem Relation Age of Onset  . Cancer Father     skin  . Fibromyalgia Father   . Chronic fatigue Father   . Arthritis Father     osteoarthritis    Allergies  Allergen Reactions  . Penicillins     Allergy not confirmed. Pt's Dad is allergic to PCN.    Current Outpatient Prescriptions on File Prior to Visit  Medication Sig Dispense Refill  . atomoxetine (STRATTERA) 40 MG capsule Take 1 capsule (40 mg total) by  mouth daily.  30 capsule  2    BP 118/96  Pulse 90  Temp(Src) 97.6 F (36.4 C) (Oral)  Resp 16  Ht 5' 10.5" (1.791 m)  Wt 240 lb (108.863 kg)  BMI 33.95 kg/m2       Objective:   Physical Exam  Constitutional: He appears well-developed and well-nourished. No distress.  HENT:  Head: Normocephalic and atraumatic.  Cardiovascular: Normal rate and regular rhythm.   No murmur heard. Pulmonary/Chest: Effort normal and breath sounds normal. No respiratory distress. He has no wheezes. He has no rales. He exhibits no tenderness.  Abdominal: Soft.  Skin: Skin is warm and dry. He is not diaphoretic.  Psychiatric: He has a normal mood and affect. His behavior is normal. Judgment and thought content normal.          Assessment & Plan:   BP Readings from Last 3 Encounters:  07/05/11 118/96  04/05/11 120/90  03/06/11 120/90   25 minutes spent with the patient today.  >50% of this time was spent counseling pt on his ED, ADD and elevated blood pressure/dietary modifications/weight loss.

## 2011-07-08 NOTE — Telephone Encounter (Signed)
I have changed dosing instructions to prior to sexual activity rather than daily.  Please notify pt that his insurance has denied the daily dosing regimen and he should use as needed.

## 2011-07-08 NOTE — Telephone Encounter (Signed)
Yes, ok to send rx for viagra 25mg  one tablet prior to sexual activity #5 with 5 refills.

## 2011-07-08 NOTE — Telephone Encounter (Signed)
Received denial letter from [facility] stating they only approve once daily dosing for the treatment of BPH. Letter forwarded to Provider for review. Please advise.

## 2011-07-08 NOTE — Telephone Encounter (Signed)
Form received, signed and faxed to [facility] [phone].

## 2011-07-08 NOTE — Telephone Encounter (Signed)
Spoke with Judeth Cornfield at [facility] [phone] and verified that Cialis is non-preferred, pt did not qualify for quantity limit exception. Viagra would be a formulary alternative 25mg  or 50mg  for tier 3 copay and would be limited to # 5 a month. If pt would like to try an alternative, would this be acceptable?

## 2011-07-09 MED ORDER — TADALAFIL 5 MG PO TABS: ORAL_TABLET | ORAL | Status: DC

## 2011-07-09 NOTE — Telephone Encounter (Signed)
Pt has been notified and prefers to continue Cialis as he can get 3 pills for $18.95 vs $45 tier 3 copay for viagra. Rx sent to [facility] in [place].

## 2011-07-09 NOTE — Telephone Encounter (Signed)
Left message on machine to return my call. 

## 2011-09-06 ENCOUNTER — Ambulatory Visit (INDEPENDENT_AMBULATORY_CARE_PROVIDER_SITE_OTHER): Payer: 59 | Admitting: Family

## 2011-09-06 ENCOUNTER — Encounter: Payer: Self-pay | Admitting: Family

## 2011-09-06 DIAGNOSIS — F988 Other specified behavioral and emotional disorders with onset usually occurring in childhood and adolescence: Secondary | ICD-10-CM

## 2011-09-06 DIAGNOSIS — I10 Essential (primary) hypertension: Secondary | ICD-10-CM

## 2011-09-06 MED ORDER — LISINOPRIL 10 MG PO TABS: 10.0000 mg | ORAL_TABLET | Freq: Every day | ORAL | Status: DC

## 2011-09-06 NOTE — Patient Instructions (Signed)
Please follow up in 1 month for blood pressure check.

## 2011-09-06 NOTE — Assessment & Plan Note (Signed)
Improved.  Plan to continue Strattera.

## 2011-09-06 NOTE — Progress Notes (Signed)
  Subjective:    Patient ID: Marcus Arias, male    DOB: [birth year], 35 y.o.   MRN: [id number]  HPI  [name] is a 35 yr old male who presents today for follow up.  ADD-  Notes improvement on stattera. Tolerating the 40mg  dose of strattera without nausea. Feels like his ADD is well controlled. Concentration is better. Notes "I can definitely tell when I don't take it.  HTN-  Has been trying to eat healthy.  Has not lost any weight since the last visit.   ED- notes that he has not filled the cialis rx.    Review of Systems    see HPI  Past Medical History  Diagnosis Date  . ADD (attention deficit disorder)   . Hearing problem     as an infant.  ??tubes  . Overweight     History   Social History  . Marital Status: Married    Spouse Name: N/A    Number of Children: 0  . Years of Education: N/A   Occupational History  . IT assistant    Social History Main Topics  . Smoking status: Never Smoker   . Smokeless tobacco: Not on file  . Alcohol Use: Yes     rare  . Drug Use: No  . Sexually Active: Not on file   Other Topics Concern  . Not on file   Social History Narrative   Exercise: swimming 3 x weeklyAssociates Degree    Past Surgical History  Procedure Date  . Hair transplant 2009    Family History  Problem Relation Age of Onset  . Cancer Father     skin  . Fibromyalgia Father   . Chronic fatigue Father   . Arthritis Father     osteoarthritis    Allergies  Allergen Reactions  . Penicillins     Allergy not confirmed. Pt's Dad is allergic to PCN.    Current Outpatient Prescriptions on File Prior to Visit  Medication Sig Dispense Refill  . atomoxetine (STRATTERA) 40 MG capsule Take 1 capsule (40 mg total) by mouth daily.  30 capsule  2  . tadalafil (CIALIS) 5 MG tablet Take one tablet by mouth as needed prior to sexual activity.  3 tablet  5    BP 120/96  Pulse 78  Temp(Src) 97.6 F (36.4 C) (Oral)  Resp 16  Ht 5' 10.5" (1.791 m)  Wt 240  lb (108.863 kg)  BMI 33.95 kg/m2    Objective:   Physical Exam  Constitutional: He appears well-developed and well-nourished.  Cardiovascular: Normal rate and regular rhythm.   No murmur heard. Pulmonary/Chest: Effort normal and breath sounds normal. No respiratory distress. He has no wheezes. He has no rales. He exhibits no tenderness.  Musculoskeletal: He exhibits no edema.  Skin: Skin is warm and dry.  Psychiatric: He has a normal mood and affect. His behavior is normal. Judgment and thought content normal.          Assessment & Plan:   BP Readings from Last 3 Encounters:  09/06/11 120/96  07/05/11 118/96  04/05/11 120/90

## 2011-09-06 NOTE — Assessment & Plan Note (Signed)
DBP still remains high.  He has been on lisinopril in the past and tolerated this without any difficulty.  Will resume.  25 minutes spent with the patient today.  >50% of this time was spent counseling pt on the importance of weight loss, healthy diet, execise and long term consequences of uncontrolled HTN.

## 2011-10-11 ENCOUNTER — Ambulatory Visit: Payer: 59 | Admitting: Family

## 2011-10-18 ENCOUNTER — Ambulatory Visit: Payer: 59 | Admitting: Family

## 2011-10-25 ENCOUNTER — Ambulatory Visit: Payer: 59 | Admitting: Family

## 2011-11-29 ENCOUNTER — Ambulatory Visit: Payer: 59 | Admitting: Family

## 2011-12-17 ENCOUNTER — Encounter: Payer: Self-pay | Admitting: Family

## 2011-12-17 ENCOUNTER — Ambulatory Visit (INDEPENDENT_AMBULATORY_CARE_PROVIDER_SITE_OTHER): Payer: 59 | Admitting: Family

## 2011-12-17 VITALS — BP 110/90 | HR 83 | Temp 98.3°F | Resp 16 | Ht 70.5 in | Wt 231.0 lb

## 2011-12-17 DIAGNOSIS — N529 Male erectile dysfunction, unspecified: Secondary | ICD-10-CM

## 2011-12-17 DIAGNOSIS — I10 Essential (primary) hypertension: Secondary | ICD-10-CM

## 2011-12-17 DIAGNOSIS — L309 Dermatitis, unspecified: Secondary | ICD-10-CM | POA: Insufficient documentation

## 2011-12-17 DIAGNOSIS — F988 Other specified behavioral and emotional disorders with onset usually occurring in childhood and adolescence: Secondary | ICD-10-CM

## 2011-12-17 DIAGNOSIS — L259 Unspecified contact dermatitis, unspecified cause: Secondary | ICD-10-CM

## 2011-12-17 LAB — BASIC METABOLIC PANEL
Calcium: 9.4 mg/dL (ref 8.4–10.5)
Potassium: 4.8 mEq/L (ref 3.5–5.3)
Sodium: 138 mEq/L (ref 135–145)

## 2011-12-17 MED ORDER — LISINOPRIL 10 MG PO TABS: 10.0000 mg | ORAL_TABLET | Freq: Every day | ORAL | Status: DC

## 2011-12-17 NOTE — Assessment & Plan Note (Signed)
Stable on current dose of Strattera, continue same.

## 2011-12-17 NOTE — Assessment & Plan Note (Signed)
BP is improved.  Continue lisinopril, obtain bmet. 

## 2011-12-17 NOTE — Assessment & Plan Note (Signed)
R shin.  Recommended that he use a good daily moisturizer such as lubriderm, and apply hydrocortisone 1% bid until healed.

## 2011-12-17 NOTE — Assessment & Plan Note (Signed)
Unchanged, he does not wish to seek treatment for this currently.

## 2011-12-17 NOTE — Progress Notes (Signed)
  Subjective:    Patient ID: Marcus Arias, male    DOB: [birth year], 35 y.o.   MRN: [id number]  HPI Marcus Arias is a 36 yr old male who presents today for follow up.  HTN- reports tolerating lisinopril without cough. Denies cp, swelling, shortness of breath.    BP Readings from Last 3 Encounters:  12/17/11 110/90  09/06/11 120/96  07/05/11 118/96   ADD-  Reports that he is no longer having stomach pains, able to hold his attention through the day.    Rash- notes that he has had a dry itching rash on his shins R>L for several months.  Reports that it is slow to heal.  ED- insurance would not cover daily dosing of cialis. He decided not to pursue treatment at this time. Review of Systems See HPI  Past Medical History  Diagnosis Date  . ADD (attention deficit disorder)   . Hearing problem     as an infant.  ??tubes  . Overweight     History   Social History  . Marital Status: Married    Spouse Name: N/A    Number of Children: 0  . Years of Education: N/A   Occupational History  . IT assistant    Social History Main Topics  . Smoking status: Never Smoker   . Smokeless tobacco: Not on file  . Alcohol Use: Yes     rare  . Drug Use: No  . Sexually Active: Not on file   Other Topics Concern  . Not on file   Social History Narrative   Exercise: swimming 3 x weeklyAssociates Degree    Past Surgical History  Procedure Date  . Hair transplant 2009    Family History  Problem Relation Age of Onset  . Cancer Father     skin  . Fibromyalgia Father   . Chronic fatigue Father   . Arthritis Father     osteoarthritis    Allergies  Allergen Reactions  . Penicillins     Allergy not confirmed. Pt's Dad is allergic to PCN.    Current Outpatient Prescriptions on File Prior to Visit  Medication Sig Dispense Refill  . atomoxetine (STRATTERA) 40 MG capsule Take 1 capsule (40 mg total) by mouth daily.  30 capsule  2  . lisinopril (PRINIVIL,ZESTRIL) 10 MG tablet Take  1 tablet (10 mg total) by mouth daily.  30 tablet  1  . tadalafil (CIALIS) 5 MG tablet Take one tablet by mouth as needed prior to sexual activity.  3 tablet  5    BP 110/90  Pulse 83  Temp(Src) 98.3 F (36.8 C) (Oral)  Resp 16  Ht 5' 10.5" (1.791 m)  Wt 231 lb (104.781 kg)  BMI 32.68 kg/m2  SpO2 97%       Objective:   Physical Exam  Constitutional: He appears well-developed and well-nourished. No distress.  Cardiovascular: Normal rate and regular rhythm.   No murmur heard. Pulmonary/Chest: Effort normal and breath sounds normal. No respiratory distress. He has no wheezes. He has no rales. He exhibits no tenderness.  Musculoskeletal: He exhibits no edema.  Skin:       Dry excoriated skin with some scabbing noted on right shin.  No significant erythema is noted.  Psychiatric: He has a normal mood and affect. His behavior is normal. Judgment and thought content normal.          Assessment & Plan:

## 2011-12-17 NOTE — Patient Instructions (Signed)
Please follow up in 3 months for a fasting physical.  Complete blood work prior to leaving.

## 2011-12-18 ENCOUNTER — Telehealth: Payer: Self-pay | Admitting: Family

## 2011-12-18 DIAGNOSIS — E119 Type 2 diabetes mellitus without complications: Secondary | ICD-10-CM | POA: Insufficient documentation

## 2011-12-18 DIAGNOSIS — R739 Hyperglycemia, unspecified: Secondary | ICD-10-CM

## 2011-12-18 NOTE — Telephone Encounter (Signed)
Pls call pt and let him know that his sugar is very mildly elevated.  I would like for him to return to the lab at his convenience for an A1C to make sure that he is not pre-diabetic. Diagnosis is hyperglycemia.

## 2011-12-18 NOTE — Telephone Encounter (Signed)
Left detailed message on voicemail and to call to arrange lab draw.

## 2011-12-23 NOTE — Telephone Encounter (Signed)
Left message to return my call.  

## 2011-12-25 NOTE — Telephone Encounter (Signed)
Notified pt and place future lab order for Friday 12/27/11 and forwarded copy to the lab.

## 2011-12-27 ENCOUNTER — Other Ambulatory Visit: Payer: Self-pay | Admitting: *Deleted

## 2011-12-27 DIAGNOSIS — R739 Hyperglycemia, unspecified: Secondary | ICD-10-CM

## 2011-12-27 LAB — HEMOGLOBIN A1C
Hgb A1c MFr Bld: 5.6 % (ref <5.7)
Mean Plasma Glucose: 114 mg/dL (ref <117)

## 2011-12-31 ENCOUNTER — Encounter: Payer: Self-pay | Admitting: Family

## 2012-01-31 ENCOUNTER — Encounter: Payer: Self-pay | Admitting: Family

## 2012-02-03 ENCOUNTER — Telehealth: Payer: Self-pay | Admitting: *Deleted

## 2012-02-03 NOTE — Telephone Encounter (Signed)
Received fax from pt last week requesting letter be sent to his school to allow him additional time to take tests, have a note taker and quiet area to work due to his ADD. Letter was completed by Provider but school is requesting a copy of formal testing indicating diagnosis of ADD which we do not have. Left message for pt to return my call re: letter completion and absence of formal testing. Does pt want to pick letter up?

## 2012-02-04 NOTE — Telephone Encounter (Signed)
Notified pt and letter placed at front desk for pick up.

## 2012-03-20 ENCOUNTER — Ambulatory Visit: Payer: 59 | Admitting: Family

## 2012-03-26 ENCOUNTER — Telehealth: Payer: Self-pay | Admitting: Family

## 2012-03-26 MED ORDER — ATOMOXETINE HCL 40 MG PO CAPS: 40.0000 mg | ORAL_CAPSULE | Freq: Every day | ORAL | Status: DC

## 2012-03-26 NOTE — Telephone Encounter (Signed)
Patient called pharmacy for refill on strattera and pharmacy told him to call us.   Refill- Strattera   [facility] on [address] in [place]

## 2012-04-01 ENCOUNTER — Encounter: Payer: Self-pay | Admitting: Family

## 2012-04-24 ENCOUNTER — Ambulatory Visit: Payer: 59 | Admitting: Family

## 2012-05-01 ENCOUNTER — Ambulatory Visit: Payer: 59 | Admitting: Family

## 2012-05-13 ENCOUNTER — Ambulatory Visit (INDEPENDENT_AMBULATORY_CARE_PROVIDER_SITE_OTHER): Payer: 59 | Admitting: Family

## 2012-05-13 ENCOUNTER — Encounter: Payer: Self-pay | Admitting: Family

## 2012-05-13 ENCOUNTER — Telehealth: Payer: Self-pay | Admitting: Family

## 2012-05-13 VITALS — BP 114/86 | HR 69 | Temp 97.8°F | Resp 16 | Ht 70.5 in | Wt 243.1 lb

## 2012-05-13 DIAGNOSIS — I10 Essential (primary) hypertension: Secondary | ICD-10-CM

## 2012-05-13 DIAGNOSIS — F988 Other specified behavioral and emotional disorders with onset usually occurring in childhood and adolescence: Secondary | ICD-10-CM

## 2012-05-13 DIAGNOSIS — K625 Hemorrhage of anus and rectum: Secondary | ICD-10-CM

## 2012-05-13 NOTE — Assessment & Plan Note (Signed)
Stable on strattera- continue same.

## 2012-05-13 NOTE — Telephone Encounter (Signed)
Would you please ask pt to complete an IFOB test and return via mail? Thanks.  Diagnosis is rectal bleeding.

## 2012-05-13 NOTE — Telephone Encounter (Signed)
Pt returned my call and will come by the office Friday morning to pick up IFOB kit.

## 2012-05-13 NOTE — Telephone Encounter (Signed)
Left detailed message for pt to return my call and arrange time to pick up IFOB kit.

## 2012-05-13 NOTE — Progress Notes (Signed)
  Subjective:    Patient ID: Marcus Arias, male    DOB: [birth year], 36 y.o.   MRN: [id number]  HPI  Reports he took lisinopril x 2 months. Denies swelling or shortness of breath.  Eating better, exercising regularly.    Blood in stool-  Reports that he had small streak in blood.  Occurred for 3 days.  Very small amount.  He reports hx of hemorrhoids in the past.    ADD-  Reports that this is well controlled on strattera.       Review of Systems See HPI  Past Medical History  Diagnosis Date  . ADD (attention deficit disorder)   . Hearing problem     as an infant.  ??tubes  . Overweight     History   Social History  . Marital Status: Married    Spouse Name: N/A    Number of Children: 0  . Years of Education: N/A   Occupational History  . IT assistant    Social History Main Topics  . Smoking status: Never Smoker   . Smokeless tobacco: Not on file  . Alcohol Use: Yes     rare  . Drug Use: No  . Sexually Active: Not on file   Other Topics Concern  . Not on file   Social History Narrative   Exercise: swimming 3 x weeklyAssociates Degree    Past Surgical History  Procedure Date  . Hair transplant 2009    Family History  Problem Relation Age of Onset  . Cancer Father     skin  . Fibromyalgia Father   . Chronic fatigue Father   . Arthritis Father     osteoarthritis    Allergies  Allergen Reactions  . Penicillins     Allergy not confirmed. Pt's Dad is allergic to PCN.    Current Outpatient Prescriptions on File Prior to Visit  Medication Sig Dispense Refill  . atomoxetine (STRATTERA) 40 MG capsule Take 1 capsule (40 mg total) by mouth daily.  30 capsule  2    BP 114/86  Pulse 69  Temp 97.8 F (36.6 C) (Oral)  Resp 16  Ht 5' 10.5" (1.791 m)  Wt 243 lb 1.3 oz (110.26 kg)  BMI 34.39 kg/m2  SpO2 99%       Objective:   Physical Exam  Constitutional: He appears well-developed and well-nourished. No distress.  Cardiovascular: Normal rate  and regular rhythm.   No murmur heard. Pulmonary/Chest: Effort normal and breath sounds normal. No respiratory distress. He has no wheezes. He has no rales. He exhibits no tenderness.  Musculoskeletal: He exhibits no edema.  Psychiatric: He has a normal mood and affect. His behavior is normal. Judgment and thought content normal.          Assessment & Plan:

## 2012-05-13 NOTE — Assessment & Plan Note (Signed)
Occurred 3 months ago.  None now.  + hx of hemorrhoids.  Suspect due to hemorrhoidal bleed.  Will have pt complete IFOB.

## 2012-05-13 NOTE — Patient Instructions (Addendum)
Please schedule a follow up appointment in 2 months.

## 2012-05-13 NOTE — Assessment & Plan Note (Signed)
BP is improved despite discontinuation of lisinopril.  He is exercising more and eating healthier.  Plan follow up in 2 months to repeat BP.

## 2012-05-15 NOTE — Telephone Encounter (Signed)
Pt stopped to pick up IFOB; kit explained and given to pt

## 2012-05-29 ENCOUNTER — Other Ambulatory Visit: Payer: 59

## 2012-05-29 DIAGNOSIS — K625 Hemorrhage of anus and rectum: Secondary | ICD-10-CM

## 2012-06-01 ENCOUNTER — Encounter: Payer: Self-pay | Admitting: Family

## 2012-07-17 ENCOUNTER — Ambulatory Visit: Payer: 59 | Admitting: Family

## 2012-07-24 ENCOUNTER — Encounter: Payer: Self-pay | Admitting: Family

## 2012-07-24 ENCOUNTER — Ambulatory Visit (INDEPENDENT_AMBULATORY_CARE_PROVIDER_SITE_OTHER): Payer: 59 | Admitting: Family

## 2012-07-24 VITALS — BP 130/92 | HR 82 | Temp 98.5°F | Resp 16 | Ht 70.5 in | Wt 249.0 lb

## 2012-07-24 DIAGNOSIS — I10 Essential (primary) hypertension: Secondary | ICD-10-CM

## 2012-07-24 DIAGNOSIS — F988 Other specified behavioral and emotional disorders with onset usually occurring in childhood and adolescence: Secondary | ICD-10-CM

## 2012-07-24 DIAGNOSIS — B079 Viral wart, unspecified: Secondary | ICD-10-CM

## 2012-07-24 MED ORDER — ATOMOXETINE HCL 40 MG PO CAPS: 40.0000 mg | ORAL_CAPSULE | Freq: Every day | ORAL | Status: DC

## 2012-07-24 NOTE — Progress Notes (Signed)
  Subjective:    Patient ID: Marcus Arias, male    DOB: 1975-12-20, 36 y.o.   MRN: [id number]  HPI  HTN-He remains off of his blood pressure medication.  Watching sodium.  Callous- Notes a "callous" on his right index finger.   ADD-Reports things are going well. Continues strattera.    Review of Systems See HPI  Past Medical History  Diagnosis Date  . ADD (attention deficit disorder)   . Hearing problem     as an infant.  ??tubes  . Overweight     History   Social History  . Marital Status: Married    Spouse Name: N/A    Number of Children: 0  . Years of Education: N/A   Occupational History  . IT assistant    Social History Main Topics  . Smoking status: Never Smoker   . Smokeless tobacco: Not on file  . Alcohol Use: Yes     rare  . Drug Use: No  . Sexually Active: Not on file   Other Topics Concern  . Not on file   Social History Narrative   Exercise: swimming 3 x weeklyAssociates Degree    Past Surgical History  Procedure Date  . Hair transplant 2009    Family History  Problem Relation Age of Onset  . Cancer Father     skin  . Fibromyalgia Father   . Chronic fatigue Father   . Arthritis Father     osteoarthritis    Allergies  Allergen Reactions  . Penicillins     Allergy not confirmed. Pt's Dad is allergic to PCN.    Current Outpatient Prescriptions on File Prior to Visit  Medication Sig Dispense Refill  . DISCONTD: atomoxetine (STRATTERA) 40 MG capsule Take 1 capsule (40 mg total) by mouth daily.  30 capsule  2    BP 130/92  Pulse 82  Temp 98.5 F (36.9 C) (Oral)  Resp 16  Ht 5' 10.5" (1.791 m)  Wt 249 lb (112.946 kg)  BMI 35.22 kg/m2  SpO2 95%       Objective:   Physical Exam  Constitutional: He appears well-developed and well-nourished. No distress.  Psychiatric: He has a normal mood and affect. His behavior is normal. Judgment and thought content normal.          Assessment & Plan:

## 2012-07-24 NOTE — Assessment & Plan Note (Signed)
BP looks ok today.  Continue to monitor closely off of meds.

## 2012-07-24 NOTE — Assessment & Plan Note (Signed)
Stable on Strattera.  Continue same.

## 2012-07-24 NOTE — Patient Instructions (Addendum)
Please follow up in 1 month for freeze treatment of the wart on your finger.

## 2012-07-24 NOTE — Assessment & Plan Note (Signed)
Explained procedure to pt.  Pt agreed to proceed with cryotherapy of wart right finger.  Pt tolerated procedure well.  Plan follow up in 1 month for re-treatment.

## 2012-08-21 ENCOUNTER — Encounter: Payer: Self-pay | Admitting: Family

## 2012-08-21 ENCOUNTER — Ambulatory Visit (INDEPENDENT_AMBULATORY_CARE_PROVIDER_SITE_OTHER): Payer: 59 | Admitting: Family

## 2012-08-21 VITALS — BP 118/68 | HR 88 | Temp 98.5°F | Resp 12 | Wt 248.0 lb

## 2012-08-21 DIAGNOSIS — B079 Viral wart, unspecified: Secondary | ICD-10-CM

## 2012-08-21 DIAGNOSIS — N498 Inflammatory disorders of other specified male genital organs: Secondary | ICD-10-CM

## 2012-08-21 DIAGNOSIS — N492 Inflammatory disorders of scrotum: Secondary | ICD-10-CM | POA: Insufficient documentation

## 2012-08-21 DIAGNOSIS — R195 Other fecal abnormalities: Secondary | ICD-10-CM | POA: Insufficient documentation

## 2012-08-21 LAB — CBC WITH DIFFERENTIAL/PLATELET
Basophils Absolute: 0 10*3/uL (ref 0.0–0.1)
Eosinophils Absolute: 0.1 10*3/uL (ref 0.0–0.7)
Eosinophils Relative: 2 % (ref 0–5)
MCH: 29.7 pg (ref 26.0–34.0)
MCV: 85.4 fL (ref 78.0–100.0)
Platelets: 239 10*3/uL (ref 150–400)
RDW: 13.1 % (ref 11.5–15.5)

## 2012-08-21 LAB — HEPATIC FUNCTION PANEL
ALT: 45 U/L (ref 0–53)
AST: 29 U/L (ref 0–37)
Bilirubin, Direct: 0.1 mg/dL (ref 0.0–0.3)
Indirect Bilirubin: 0.4 mg/dL (ref 0.0–0.9)

## 2012-08-21 MED ORDER — CEPHALEXIN 500 MG PO CAPS: 500.0000 mg | ORAL_CAPSULE | Freq: Four times a day (QID) | ORAL | Status: AC

## 2012-08-21 NOTE — Assessment & Plan Note (Signed)
Small, will rx with keflex.  

## 2012-08-21 NOTE — Assessment & Plan Note (Signed)
Wart frozen right index finger.  Pt tolerated well.

## 2012-08-21 NOTE — Progress Notes (Signed)
  Subjective:    Patient ID: Marcus Arias, male    DOB: 09-May-1976, 36 y.o.   MRN: [id number]  HPI  Marcus Arias is a 36 yr old male who presents today with several concerns:  1. Wart on finger- Had wart treated last month, notes slightly smaller.He would like to have the wart frozen.  2. Skin soreness on right scrotum-  Reports that the area appeared 2 weeks ago and is sore when he walks. No drainage.  Initially "picked a place" and got a little blood.  3.  Dark colored stool x 1 month. Reports that the color has changed.  Diet is unchanged.  Stool is described as a blackish/pasty charcoal look.  Wife is concerned that he may have crohns disease. He denies abdominal pain.      Review of Systems See HPI  Past Medical History  Diagnosis Date  . ADD (attention deficit disorder)   . Hearing problem     as an infant.  ??tubes  . Overweight     History   Social History  . Marital Status: Married    Spouse Name: N/A    Number of Children: 0  . Years of Education: N/A   Occupational History  . IT assistant    Social History Main Topics  . Smoking status: Never Smoker   . Smokeless tobacco: Not on file  . Alcohol Use: Yes     rare  . Drug Use: No  . Sexually Active: Not on file   Other Topics Concern  . Not on file   Social History Narrative   Exercise: swimming 3 x weeklyAssociates Degree    Past Surgical History  Procedure Date  . Hair transplant 2009    Family History  Problem Relation Age of Onset  . Cancer Father     skin  . Fibromyalgia Father   . Chronic fatigue Father   . Arthritis Father     osteoarthritis    Allergies  Allergen Reactions  . Penicillins     Allergy not confirmed. Pt's Dad is allergic to PCN.    Current Outpatient Prescriptions on File Prior to Visit  Medication Sig Dispense Refill  . atomoxetine (STRATTERA) 40 MG capsule Take 1 capsule (40 mg total) by mouth daily.  30 capsule  2    There were no vitals taken for this  visit.       Objective:   Physical Exam  Constitutional: He appears well-developed and well-nourished.  HENT:  Head: Normocephalic and atraumatic.  Genitourinary: Guaiac negative stool.     Psychiatric: He has a normal mood and affect. His behavior is normal. Judgment and thought content normal.          Assessment & Plan:

## 2012-08-21 NOTE — Patient Instructions (Addendum)
Please complete your blood work prior to leaving. Follow up in 1 month for refreeze of wart on finger. Call if you see blood in stool, if increased pain or swelling of area on scrotum- or if area does not improve with antibiotics.

## 2012-08-21 NOTE — Assessment & Plan Note (Signed)
Pt is heme negative.  Will obtain CBC, LFT.  If dark stools persist he may wish to see GI.  I have asked him to let me know.  We discussed that in the absence of heme+ stool and abdominal pain and if CBC, LFT normal, then it is is unlikely to be concerning. Likely dietary.

## 2012-08-23 ENCOUNTER — Encounter: Payer: Self-pay | Admitting: Family

## 2012-09-18 ENCOUNTER — Ambulatory Visit: Payer: 59 | Admitting: Family

## 2012-10-02 ENCOUNTER — Encounter: Payer: Self-pay | Admitting: Family

## 2012-10-02 ENCOUNTER — Ambulatory Visit (INDEPENDENT_AMBULATORY_CARE_PROVIDER_SITE_OTHER): Payer: 59 | Admitting: Family

## 2012-10-02 VITALS — BP 126/100 | HR 93 | Temp 98.1°F | Resp 16 | Ht 70.5 in | Wt 244.1 lb

## 2012-10-02 DIAGNOSIS — I10 Essential (primary) hypertension: Secondary | ICD-10-CM

## 2012-10-02 DIAGNOSIS — B079 Viral wart, unspecified: Secondary | ICD-10-CM

## 2012-10-02 DIAGNOSIS — F988 Other specified behavioral and emotional disorders with onset usually occurring in childhood and adolescence: Secondary | ICD-10-CM

## 2012-10-02 DIAGNOSIS — R195 Other fecal abnormalities: Secondary | ICD-10-CM

## 2012-10-02 MED ORDER — AMLODIPINE BESYLATE 5 MG PO TABS: 5.0000 mg | ORAL_TABLET | Freq: Every day | ORAL | Status: DC

## 2012-10-02 NOTE — Patient Instructions (Addendum)
Please follow up in 1 month for BP check.  

## 2012-10-02 NOTE — Assessment & Plan Note (Signed)
Improving.  Wart was frozen with liquid nitrogen today. Pt tolerated procedure without difficulty.  OK to apply OTC salicylic acid daily starting in a week or so as long as there is no blistering/open skin.

## 2012-10-02 NOTE — Assessment & Plan Note (Signed)
He would like to try something other than lisinopril. Will rx with amlodipine. We discussed the importance of low sodium diet as well.

## 2012-10-02 NOTE — Assessment & Plan Note (Signed)
Stable on strattera.  Continue same.   

## 2012-10-02 NOTE — Assessment & Plan Note (Signed)
Resolved.  Lab work was normal.  Was likely dietary related.

## 2012-10-02 NOTE — Progress Notes (Signed)
  Subjective:    Patient ID: Marcus Arias, male    DOB: 01-03-1976, 36 y.o.   MRN: [id number]  HPI  Mr. Marcus Arias is a 36 yr old male who presents today for follow up.  HTN- Reports that he has been eating "a lot of mac and cheese." Notes BP generally runs higher when he is eating mac and cheese.  He stopped lisinopril as he though that it may have been causing his dark stools.  Wart- slightly smaller since treatment last month.  ADD- reports that this is well controlled on stattera.  Review of Systems See HPI  Past Medical History  Diagnosis Date  . ADD (attention deficit disorder)   . Hearing problem     as an infant.  ??tubes  . Overweight     History   Social History  . Marital Status: Married    Spouse Name: N/A    Number of Children: 0  . Years of Education: N/A   Occupational History  . IT assistant    Social History Main Topics  . Smoking status: Never Smoker   . Smokeless tobacco: Not on file  . Alcohol Use: Yes     Comment: rare  . Drug Use: No  . Sexually Active: Not on file   Other Topics Concern  . Not on file   Social History Narrative   Exercise: swimming 3 x weekly[employer]ates Degree    Past Surgical History  Procedure Date  . Hair transplant 2009    Family History  Problem Relation Age of Onset  . Cancer Father     skin  . Fibromyalgia Father   . Chronic fatigue Father   . Arthritis Father     osteoarthritis    Allergies  Allergen Reactions  . Penicillins     Allergy not confirmed. Pt's Dad is allergic to PCN.    Current Outpatient Prescriptions on File Prior to Visit  Medication Sig Dispense Refill  . atomoxetine (STRATTERA) 40 MG capsule Take 1 capsule (40 mg total) by mouth daily.  30 capsule  2  . amLODipine (NORVASC) 5 MG tablet Take 1 tablet (5 mg total) by mouth daily.  30 tablet  1    BP 126/100  Pulse 93  Temp 98.1 F (36.7 C) (Oral)  Resp 16  Ht 5' 10.5" (1.791 m)  Wt 244 lb 1.9 oz (110.732 kg)  BMI 34.53 kg/m2   SpO2 96%       Objective:   Physical Exam  Constitutional: He appears well-developed and well-nourished. No distress.  Cardiovascular: Normal rate and regular rhythm.   No murmur heard. Pulmonary/Chest: Effort normal and breath sounds normal. No respiratory distress. He has no wheezes. He has no rales. He exhibits no tenderness.  Skin:       Wart right index finger- slightly smaller than last visit.           Assessment & Plan:

## 2012-11-06 ENCOUNTER — Ambulatory Visit: Payer: 59 | Admitting: Family

## 2012-11-27 ENCOUNTER — Ambulatory Visit: Payer: 59 | Admitting: Family

## 2012-12-25 ENCOUNTER — Ambulatory Visit: Payer: 59 | Admitting: Family

## 2012-12-28 ENCOUNTER — Telehealth: Payer: Self-pay | Admitting: *Deleted

## 2012-12-28 NOTE — Telephone Encounter (Signed)
Received call from pt stating he is currently on Strattera 40mg  daily. Notes that if he doesn't eat a lot before he takes medication he becomes nauseated and has upset stomach. Pt is wanting to know if he can get new Rx for reduced strength (20mg )?  Please advise.

## 2012-12-29 MED ORDER — ATOMOXETINE HCL 25 MG PO CAPS: 25.0000 mg | ORAL_CAPSULE | Freq: Every day | ORAL | Status: DC

## 2012-12-29 NOTE — Telephone Encounter (Signed)
Notified pt and he voices understanding. 

## 2012-12-29 NOTE — Telephone Encounter (Signed)
Rx sent for 25 mg dose (does not come in 20 mg).  He should keep upcoming apt later this month so we can see how he is doing on this dose.

## 2013-01-08 ENCOUNTER — Ambulatory Visit (INDEPENDENT_AMBULATORY_CARE_PROVIDER_SITE_OTHER): Payer: 59 | Admitting: Family

## 2013-01-08 ENCOUNTER — Encounter: Payer: Self-pay | Admitting: Family

## 2013-01-08 VITALS — BP 118/90 | HR 73 | Temp 97.4°F | Resp 16 | Ht 70.5 in | Wt 240.0 lb

## 2013-01-08 DIAGNOSIS — B079 Viral wart, unspecified: Secondary | ICD-10-CM

## 2013-01-08 MED ORDER — BETAMETHASONE DIPROPIONATE 0.05 % EX CREA: TOPICAL_CREAM | Freq: Two times a day (BID) | CUTANEOUS | Status: DC

## 2013-01-08 NOTE — Progress Notes (Signed)
  Subjective:    Patient ID: Marcus Arias, male    DOB: 10/26/1976, 37 y.o.   MRN: [id number]  HPI  Marcus Arias is a 37 yr old male who presents today for follow up.  1) HTN- reports that he is not currently taking amlodipine  2) Rash- left forearm, non-pruritic.    3) Wart- right index finger  4) ADHD- currently on decreased dose of strattera due to nausea. He stopped the 40mg  dose and has not picked up the 25 mg tab yet.  Reports that he has to overeat to "keep from getting sick."    Review of Systems    see HPI Past Medical History  Diagnosis Date  . ADD (attention deficit disorder)   . Hearing problem     as an infant.  ??tubes  . Overweight     History   Social History  . Marital Status: Married    Spouse Name: N/A    Number of Children: 0  . Years of Education: N/A   Occupational History  . IT assistant    Social History Main Topics  . Smoking status: Never Smoker   . Smokeless tobacco: Not on file  . Alcohol Use: Yes     Comment: rare  . Drug Use: No  . Sexually Active: Not on file   Other Topics Concern  . Not on file   Social History Narrative   Exercise: swimming 3 x weekly   Associates Degree          Past Surgical History  Procedure Laterality Date  . Hair transplant  2009    Family History  Problem Relation Age of Onset  . Cancer Father     skin  . Fibromyalgia Father   . Chronic fatigue Father   . Arthritis Father     osteoarthritis    Allergies  Allergen Reactions  . Penicillins     Allergy not confirmed. Pt's Dad is allergic to PCN.    Current Outpatient Prescriptions on File Prior to Visit  Medication Sig Dispense Refill  . atomoxetine (STRATTERA) 25 MG capsule Take 1 capsule (25 mg total) by mouth daily.  30 capsule  0  . amLODipine (NORVASC) 5 MG tablet Take 1 tablet (5 mg total) by mouth daily.  30 tablet  1   No current facility-administered medications on file prior to visit.    BP 118/90  Pulse 73   Temp(Src) 97.4 F (36.3 C) (Oral)  Resp 16  Ht 5' 10.5" (1.791 m)  Wt 240 lb (108.863 kg)  BMI 33.94 kg/m2  SpO2 96%    Objective:   Physical Exam  Constitutional: He appears well-developed and well-nourished. No distress.  Cardiovascular: Normal rate and regular rhythm.   No murmur heard. Pulmonary/Chest: Effort normal and breath sounds normal. No respiratory distress. He has no wheezes. He has no rales. He exhibits no tenderness.  Skin:  Ecematous/excoriated rash noted on left forearm and small patch on chest.  calloused wart noted right index finger (palmar surface DIP)        Assessment & Plan:

## 2013-01-08 NOTE — Patient Instructions (Addendum)
Please schedule a follow up appointment in 1 month.

## 2013-01-08 NOTE — Assessment & Plan Note (Signed)
Wart frozen with liquid nitrogen today

## 2013-01-08 NOTE — Assessment & Plan Note (Signed)
Trial of strattera 25

## 2013-01-08 NOTE — Assessment & Plan Note (Signed)
Recommended good moisturizer, diprolene bid short term.

## 2013-01-08 NOTE — Assessment & Plan Note (Addendum)
BP Readings from Last 3 Encounters:  01/08/13 118/90  10/02/12 126/100  08/21/12 118/68   He has changed his diet, BP looks ok off of med.  Repeat in 1 month, continue with diet alone.

## 2013-01-29 ENCOUNTER — Ambulatory Visit: Payer: 59 | Admitting: Family

## 2013-02-08 ENCOUNTER — Ambulatory Visit (INDEPENDENT_AMBULATORY_CARE_PROVIDER_SITE_OTHER): Payer: 59 | Admitting: Family

## 2013-02-08 ENCOUNTER — Encounter: Payer: Self-pay | Admitting: Family

## 2013-02-08 VITALS — BP 122/82 | HR 75 | Temp 97.5°F | Resp 18 | Ht 70.5 in | Wt 239.1 lb

## 2013-02-08 DIAGNOSIS — F988 Other specified behavioral and emotional disorders with onset usually occurring in childhood and adolescence: Secondary | ICD-10-CM

## 2013-02-08 DIAGNOSIS — I10 Essential (primary) hypertension: Secondary | ICD-10-CM

## 2013-02-08 DIAGNOSIS — J301 Allergic rhinitis due to pollen: Secondary | ICD-10-CM

## 2013-02-08 DIAGNOSIS — B079 Viral wart, unspecified: Secondary | ICD-10-CM

## 2013-02-08 NOTE — Assessment & Plan Note (Signed)
Stable with diet alone, continue to monitor.

## 2013-02-08 NOTE — Assessment & Plan Note (Signed)
Treated with liquid nitrogen today. Recommended otc salicylic acid treatments between visits.

## 2013-02-08 NOTE — Progress Notes (Signed)
Subjective:    Patient ID: Marcus Arias, male    DOB: 03/29/1976, 37 y.o.   MRN: [id number]  HPI  Marcus Arias is a 37 yr old male who presents today for follow up.  1) HTN- he remains off of amlodipine.  He denies lower extremity swelling, CP or SOB.  2) ADD- On decreased dose of strattera. Reports that nausea has subsided.  Reports that he continues to feel controlled on this lower dose.    3) Cough- last week he developed sore throat.  + cough, worse at night.  Productive.  Some hoarseness.  Reports dry eyes.  Occasional itching of eyes.  Denies fever.    Review of Systems See HPI   Past Medical History  Diagnosis Date  . ADD (attention deficit disorder)   . Hearing problem     as an infant.  ??tubes  . Overweight     History   Social History  . Marital Status: Married    Spouse Name: N/A    Number of Children: 0  . Years of Education: N/A   Occupational History  . IT assistant    Social History Main Topics  . Smoking status: Never Smoker   . Smokeless tobacco: Not on file  . Alcohol Use: Yes     Comment: rare  . Drug Use: No  . Sexually Active: Not on file   Other Topics Concern  . Not on file   Social History Narrative   Exercise: swimming 3 x weekly   Associates Degree          Past Surgical History  Procedure Laterality Date  . Hair transplant  2009    Family History  Problem Relation Age of Onset  . Cancer Father     skin  . Fibromyalgia Father   . Chronic fatigue Father   . Arthritis Father     osteoarthritis    Allergies  Allergen Reactions  . Penicillins     Allergy not confirmed. Pt's Dad is allergic to PCN.    Current Outpatient Prescriptions on File Prior to Visit  Medication Sig Dispense Refill  . atomoxetine (STRATTERA) 25 MG capsule Take 1 capsule (25 mg total) by mouth daily.  30 capsule  0  . betamethasone dipropionate (DIPROLENE) 0.05 % cream Apply topically 2 (two) times daily.  30 g  0   No current  facility-administered medications on file prior to visit.    BP 122/82  Pulse 75  Temp(Src) 97.5 F (36.4 C) (Oral)  Resp 18  Ht 5' 10.5" (1.791 m)  Wt 239 lb 1.3 oz (108.446 kg)  BMI 33.81 kg/m2  SpO2 98%       Objective:   Physical Exam  Constitutional: He is oriented to person, place, and time. He appears well-developed and well-nourished. No distress.  HENT:  Head: Normocephalic and atraumatic.  Right Ear: Tympanic membrane and ear canal normal.  Left Ear: Tympanic membrane and ear canal normal.  Mouth/Throat: No oropharyngeal exudate, posterior oropharyngeal edema or posterior oropharyngeal erythema.  Cardiovascular: Normal rate and regular rhythm.   No murmur heard. Pulmonary/Chest: Effort normal and breath sounds normal. No respiratory distress. He has no wheezes. He has no rales. He exhibits no tenderness.  Musculoskeletal: He exhibits no edema.  Lymphadenopathy:    He has no cervical adenopathy.  Neurological: He is alert and oriented to person, place, and time.  Psychiatric: He has a normal mood and affect. His behavior is normal. Judgment and thought content  normal.          Assessment & Plan:

## 2013-02-08 NOTE — Patient Instructions (Addendum)
Please follow up in 3 months, sooner if problems/concerns.  

## 2013-02-08 NOTE — Assessment & Plan Note (Signed)
Tolerating decreased dose of strattera.  Stable. Continue same.

## 2013-02-08 NOTE — Assessment & Plan Note (Signed)
Trial of Zyrtec. Pt is instructed to call if symptoms worsen or if not improved in 1 week.

## 2013-05-10 ENCOUNTER — Ambulatory Visit: Payer: 59 | Admitting: Family

## 2013-05-28 ENCOUNTER — Ambulatory Visit: Payer: 59 | Admitting: Family

## 2013-06-14 ENCOUNTER — Other Ambulatory Visit: Payer: Self-pay | Admitting: Family

## 2013-06-15 NOTE — Telephone Encounter (Signed)
eScribe request for refill on Strattera 25 mg Last filled - 02.18.14, #30x0 Last AEX - 03.31.14 Next AEX - 3 Months Please Advise/SLS

## 2013-06-16 NOTE — Telephone Encounter (Signed)
Refill sent. Please call pt to arrange appt.

## 2013-06-16 NOTE — Telephone Encounter (Signed)
OK to send #1 refill, need ov prior to additional refills.

## 2013-06-17 NOTE — Telephone Encounter (Signed)
Left detailed message informing patient of medication refill and that he needs to call our office to schedule an appointment before further refills can be given.

## 2013-07-19 ENCOUNTER — Encounter: Payer: Self-pay | Admitting: Family

## 2013-07-19 ENCOUNTER — Ambulatory Visit (INDEPENDENT_AMBULATORY_CARE_PROVIDER_SITE_OTHER): Payer: BC Managed Care – PPO | Admitting: Family

## 2013-07-19 VITALS — BP 128/90 | HR 79 | Temp 97.6°F | Resp 18 | Ht 70.5 in | Wt 246.1 lb

## 2013-07-19 DIAGNOSIS — I10 Essential (primary) hypertension: Secondary | ICD-10-CM

## 2013-07-19 DIAGNOSIS — R7309 Other abnormal glucose: Secondary | ICD-10-CM

## 2013-07-19 DIAGNOSIS — B079 Viral wart, unspecified: Secondary | ICD-10-CM

## 2013-07-19 DIAGNOSIS — F988 Other specified behavioral and emotional disorders with onset usually occurring in childhood and adolescence: Secondary | ICD-10-CM

## 2013-07-19 DIAGNOSIS — R739 Hyperglycemia, unspecified: Secondary | ICD-10-CM

## 2013-07-19 LAB — HEMOGLOBIN A1C: Mean Plasma Glucose: 131 mg/dL — ABNORMAL HIGH (ref <117)

## 2013-07-19 NOTE — Assessment & Plan Note (Signed)
Stable on strattera.  Continue same.   

## 2013-07-19 NOTE — Assessment & Plan Note (Signed)
BP stable off of meds.  Monitor.  

## 2013-07-19 NOTE — Patient Instructions (Addendum)
Please complete your lab work prior to leaving. Follow up in 1 month for wart treatment if not resolved, otherwise if your wart is resolved after this treatment, we will plan to see you back in 3 months.

## 2013-07-19 NOTE — Assessment & Plan Note (Addendum)
Improving. Liquid nitrogen applied to wart right index finger.  Pt tolerated well.

## 2013-07-19 NOTE — Assessment & Plan Note (Signed)
Will check BMET and A1C.

## 2013-07-19 NOTE — Progress Notes (Signed)
  Subjective:    Patient ID: KRIS NO, male    DOB: [birth year], 37 y.o.   MRN: [id number]  HPI  Mr. Morrone is a 37 yr old male who presents today for follow up:  HTN- denies cp/sob or swelling. He is not currently on BP meds.   ADHD- stable on strattera.  Wart- reports that he has been using the otc acid and the wart is improving..    Review of Systems See HPI  Past Medical History  Diagnosis Date  . ADD (attention deficit disorder)   . Hearing problem     as an infant.  ??tubes  . Overweight(278.02)     History   Social History  . Marital Status: Married    Spouse Name: N/A    Number of Children: 0  . Years of Education: N/A   Occupational History  . IT assistant    Social History Main Topics  . Smoking status: Never Smoker   . Smokeless tobacco: Not on file  . Alcohol Use: Yes     Comment: rare  . Drug Use: No  . Sexual Activity: Not on file   Other Topics Concern  . Not on file   Social History Narrative   Exercise: swimming 3 x weekly   Associates Degree          Past Surgical History  Procedure Laterality Date  . Hair transplant  2009    Family History  Problem Relation Age of Onset  . Cancer Father     skin  . Fibromyalgia Father   . Chronic fatigue Father   . Arthritis Father     osteoarthritis    Allergies  Allergen Reactions  . Penicillins     Allergy not confirmed. Pt's Dad is allergic to PCN.    Current Outpatient Prescriptions on File Prior to Visit  Medication Sig Dispense Refill  . betamethasone dipropionate (DIPROLENE) 0.05 % cream Apply topically 2 (two) times daily.  30 g  0  . STRATTERA 25 MG capsule TAKE 1 CAPSULE EVERY DAY  30 capsule  0   No current facility-administered medications on file prior to visit.    BP 128/90  Pulse 79  Temp(Src) 97.6 F (36.4 C) (Oral)  Resp 18  Ht 5' 10.5" (1.791 m)  Wt 246 lb 1.3 oz (111.621 kg)  BMI 34.8 kg/m2  SpO2 98%       Objective:   Physical Exam   Constitutional: He is oriented to person, place, and time. He appears well-developed and well-nourished. No distress.  HENT:  Head: Normocephalic and atraumatic.  Cardiovascular: Normal rate and regular rhythm.   No murmur heard. Pulmonary/Chest: Effort normal and breath sounds normal. No respiratory distress. He has no wheezes. He has no rales. He exhibits no tenderness.  Neurological: He is alert and oriented to person, place, and time.  Skin: Skin is warm and dry.  Small wart/calloused skin noted distal right index finger.  Psychiatric: He has a normal mood and affect. His behavior is normal. Judgment and thought content normal.          Assessment & Plan:

## 2013-07-20 ENCOUNTER — Telehealth: Payer: Self-pay | Admitting: Family

## 2013-07-20 LAB — BASIC METABOLIC PANEL
Chloride: 101 mEq/L (ref 96–112)
Potassium: 4.5 mEq/L (ref 3.5–5.3)

## 2013-07-20 NOTE — Telephone Encounter (Signed)
A1C is looking worse and is showing borderline diabetes.  I would recommend that he work on weight loss avoiding concentrated sweets and white fluffy carbs, get regular exercise. Follow up in 3 months.

## 2013-07-20 NOTE — Telephone Encounter (Signed)
Attempted to reach pt at cell# [phone]) and he voices understanding.

## 2013-08-15 ENCOUNTER — Other Ambulatory Visit: Payer: Self-pay | Admitting: Family

## 2013-08-18 ENCOUNTER — Ambulatory Visit (INDEPENDENT_AMBULATORY_CARE_PROVIDER_SITE_OTHER): Payer: BC Managed Care – PPO | Admitting: Family

## 2013-08-18 ENCOUNTER — Encounter: Payer: Self-pay | Admitting: Family

## 2013-08-18 VITALS — BP 136/86 | HR 78 | Temp 98.0°F | Resp 16 | Ht 70.5 in | Wt 250.0 lb

## 2013-08-18 DIAGNOSIS — Z23 Encounter for immunization: Secondary | ICD-10-CM

## 2013-08-18 DIAGNOSIS — IMO0001 Reserved for inherently not codable concepts without codable children: Secondary | ICD-10-CM

## 2013-08-18 DIAGNOSIS — M7918 Myalgia, other site: Secondary | ICD-10-CM

## 2013-08-18 DIAGNOSIS — R109 Unspecified abdominal pain: Secondary | ICD-10-CM

## 2013-08-18 LAB — HEPATIC FUNCTION PANEL
Alkaline Phosphatase: 73 U/L (ref 39–117)
Indirect Bilirubin: 0.4 mg/dL (ref 0.0–0.9)
Total Bilirubin: 0.5 mg/dL (ref 0.3–1.2)

## 2013-08-18 MED ORDER — NAPROXEN 500 MG PO TABS: 500.0000 mg | ORAL_TABLET | Freq: Two times a day (BID) | ORAL | Status: DC | PRN

## 2013-08-18 NOTE — Patient Instructions (Signed)
Please complete blood work prior to leaving. Start naproxen for pain.  Call if symptoms worsen, or if not improved in 2-3 days.

## 2013-08-18 NOTE — Assessment & Plan Note (Signed)
I suspect that abdominal pain is due to musculoskeletal strain.  Will rx with naproxen.  Pt to call if symptoms worsen or if not improved in 2-3 days.  In addition, I will check LFT and lipase due to location of pain, however I suspect these tests will be normal.

## 2013-08-18 NOTE — Progress Notes (Signed)
  Subjective:    Patient ID: Marcus Arias, male    DOB: 12-23-1975, 37 y.o.   MRN: [id number]  HPI  Mr. See is a 37 yr old male who presents today with chief complaint of left sided abdominal soreness.  Notes that the pain is worse with deep inspiration.  Finds himself breathing more shallow due to discomfort. Symptoms started yesterday.  Has started a new gym routine which is very strenuous. His last work out was on 10/4. Pain is also made worse with movement.  He denies associated nausea or fever.  Bowel habits are unchanged.    Review of Systems See HPI  Past Medical History  Diagnosis Date  . ADD (attention deficit disorder)   . Hearing problem     as an infant.  ??tubes  . Overweight(278.02)     History   Social History  . Marital Status: Married    Spouse Name: N/A    Number of Children: 0  . Years of Education: N/A   Occupational History  . IT assistant    Social History Main Topics  . Smoking status: Never Smoker   . Smokeless tobacco: Not on file  . Alcohol Use: Yes     Comment: rare  . Drug Use: No  . Sexual Activity: Not on file   Other Topics Concern  . Not on file   Social History Narrative   Exercise: swimming 3 x weekly   Associates Degree          Past Surgical History  Procedure Laterality Date  . Hair transplant  2009    Family History  Problem Relation Age of Onset  . Cancer Father     skin  . Fibromyalgia Father   . Chronic fatigue Father   . Arthritis Father     osteoarthritis    Allergies  Allergen Reactions  . Penicillins     Allergy not confirmed. Pt's Dad is allergic to PCN.    Current Outpatient Prescriptions on File Prior to Visit  Medication Sig Dispense Refill  . STRATTERA 25 MG capsule TAKE 1 CAPSULE EVERY DAY  30 capsule  2   No current facility-administered medications on file prior to visit.    BP 136/86  Pulse 78  Temp(Src) 98 F (36.7 C) (Oral)  Resp 16  Ht 5' 10.5" (1.791 m)  Wt 250 lb (113.399  kg)  BMI 35.35 kg/m2  SpO2 97%       Objective:   Physical Exam  Constitutional: He is oriented to person, place, and time. He appears well-developed and well-nourished. No distress.  Cardiovascular: Normal rate and regular rhythm.   No murmur heard. Pulmonary/Chest: Effort normal and breath sounds normal. No respiratory distress. He has no wheezes. He has no rales. He exhibits no tenderness.  Abdominal: Soft. Bowel sounds are normal. He exhibits no distension and no mass. There is no tenderness. There is no rebound and no guarding.  Musculoskeletal: He exhibits no edema.  Neurological: He is alert and oriented to person, place, and time.  Psychiatric: He has a normal mood and affect. His behavior is normal. Judgment and thought content normal.          Assessment & Plan:

## 2013-08-19 ENCOUNTER — Encounter: Payer: Self-pay | Admitting: Family

## 2013-10-15 ENCOUNTER — Ambulatory Visit: Payer: BC Managed Care – PPO | Admitting: Family

## 2013-10-22 ENCOUNTER — Ambulatory Visit: Payer: BC Managed Care – PPO | Admitting: Family

## 2013-12-03 ENCOUNTER — Ambulatory Visit (INDEPENDENT_AMBULATORY_CARE_PROVIDER_SITE_OTHER): Payer: BC Managed Care – PPO | Admitting: Family

## 2013-12-03 VITALS — BP 130/86 | HR 88 | Resp 16 | Wt 247.2 lb

## 2013-12-03 DIAGNOSIS — E781 Pure hyperglyceridemia: Secondary | ICD-10-CM

## 2013-12-03 DIAGNOSIS — F988 Other specified behavioral and emotional disorders with onset usually occurring in childhood and adolescence: Secondary | ICD-10-CM

## 2013-12-03 DIAGNOSIS — R7303 Prediabetes: Secondary | ICD-10-CM

## 2013-12-03 DIAGNOSIS — I10 Essential (primary) hypertension: Secondary | ICD-10-CM

## 2013-12-03 DIAGNOSIS — R7309 Other abnormal glucose: Secondary | ICD-10-CM

## 2013-12-03 LAB — LIPID PANEL
CHOLESTEROL: 211 mg/dL — AB (ref 0–200)
HDL: 30 mg/dL — ABNORMAL LOW (ref >39)
Total CHOL/HDL Ratio: 7 Ratio
Triglycerides: 534 mg/dL — ABNORMAL HIGH (ref <150)

## 2013-12-03 LAB — HEMOGLOBIN A1C
HEMOGLOBIN A1C: 6.1 % — AB (ref <5.7)
MEAN PLASMA GLUCOSE: 128 mg/dL — AB (ref <117)

## 2013-12-03 MED ORDER — ATOMOXETINE HCL 25 MG PO CAPS: ORAL_CAPSULE | ORAL | Status: DC

## 2013-12-03 NOTE — Progress Notes (Signed)
Subjective:    Patient ID: Marcus Arias, male    DOB: [birth year], 38 y.o.   MRN: [id number]  HPI  Mr. Marcus Arias is a 38 yr old male who presents today for follow up.  1) ADD- maintained on strattera. Reports that ADHD is well controlled.  Takes dose mid day and feels tha thtis works the best for him.  Has nausea if he takes in the AM on empty stomach.   2) HTN- currently off of BP meds.   BP Readings from Last 3 Encounters:  08/18/13 136/86  07/19/13 128/90  02/08/13 122/82   3) Hypertriglyceridemia-   Lab Results  Component Value Date   CHOL 208* 07/13/2010   HDL 29* 07/13/2010   LDLCALC See Comment mg/dL 4/0/98119/12/2009   TRIG 914408* 7/8/29569/12/2009   CHOLHDL 7.2 Ratio 07/13/2010   4) Hyperglycemia- A1C 6.2.  He has started to see a Research scientist (medical)wholist practitioner. Had food allergy testing.  He is allergic to artichokes, gluten, dairy, almonds, bananas, apples, corn- he has never had any adverse reaction to these foods.  Denies polyuria/polydipsia.  5) Snoring-  he reports that he has consultation for a sleep study today which was ordered by the holistic provider.  He is using melatonin which is helping him to get a more restful sleep.  Review of Systems See HPI  Past Medical History  Diagnosis Date  . ADD (attention deficit disorder)   . Hearing problem     as an infant.  ??tubes  . Overweight     History   Social History  . Marital Status: Married    Spouse Name: N/A    Number of Children: 0  . Years of Education: N/A   Occupational History  . IT assistant    Social History Main Topics  . Smoking status: Never Smoker   . Smokeless tobacco: Not on file  . Alcohol Use: Yes     Comment: rare  . Drug Use: No  . Sexual Activity: Not on file   Other Topics Concern  . Not on file   Social History Narrative   Exercise: swimming 3 x weekly   Associates Degree          Past Surgical History  Procedure Laterality Date  . Hair transplant  2009    Family History  Problem Relation  Age of Onset  . Cancer Father     skin  . Fibromyalgia Father   . Chronic fatigue Father   . Arthritis Father     osteoarthritis    Allergies  Allergen Reactions  . Penicillins     Allergy not confirmed. Pt's Dad is allergic to PCN.    Current Outpatient Prescriptions on File Prior to Visit  Medication Sig Dispense Refill  . naproxen (NAPROSYN) 500 MG tablet Take 1 tablet (500 mg total) by mouth 2 (two) times daily as needed.  14 tablet  0  . STRATTERA 25 MG capsule TAKE 1 CAPSULE EVERY DAY  30 capsule  2   No current facility-administered medications on file prior to visit.    There were no vitals taken for this visit.       Objective:   Physical Exam  Constitutional: He is oriented to person, place, and time. He appears well-developed and well-nourished. No distress.  HENT:  Head: Normocephalic and atraumatic.  Cardiovascular: Normal rate and regular rhythm.   No murmur heard. Pulmonary/Chest: Effort normal and breath sounds normal. No respiratory distress. He has no wheezes. He has  no rales. He exhibits no tenderness.  Musculoskeletal: He exhibits no edema.  Neurological: He is alert and oriented to person, place, and time.  Skin: Skin is warm and dry.  Psychiatric: He has a normal mood and affect. His behavior is normal. Judgment and thought content normal.          Assessment & Plan:

## 2013-12-03 NOTE — Patient Instructions (Signed)
Please complete your lab work prior to leaving. Please schedule a follow up appointment in 6 months.   

## 2013-12-03 NOTE — Assessment & Plan Note (Signed)
Fasting today, obtain FLP.

## 2013-12-03 NOTE — Assessment & Plan Note (Signed)
Stable on strattera.  Continue same.

## 2013-12-03 NOTE — Assessment & Plan Note (Signed)
Check A1C 

## 2013-12-03 NOTE — Assessment & Plan Note (Signed)
BP stable on low sodium diet, continue to monitor.

## 2013-12-06 ENCOUNTER — Telehealth: Payer: Self-pay | Admitting: Family

## 2013-12-06 DIAGNOSIS — E785 Hyperlipidemia, unspecified: Secondary | ICD-10-CM

## 2013-12-06 MED ORDER — ATORVASTATIN CALCIUM 10 MG PO TABS: 10.0000 mg | ORAL_TABLET | Freq: Every day | ORAL | Status: DC

## 2013-12-06 NOTE — Telephone Encounter (Signed)
Please call pt and let him know that sugar is still elevated in the borderline diabetes range and triglycerides are elevated.  I would like for him to work really hard on healthy diet- avoid concentrated sweets, white fluffy carbs,  Eat more non-starchy vegetables and lean meats. Get some exercise every day. Start lipitor, start fish oil 2000mg  bid.  Follow up in 6 weeks for FLP/LFT dx dyslipidemia.

## 2013-12-06 NOTE — Telephone Encounter (Signed)
Relevant patient education assigned to patient using Emmi. ° °

## 2013-12-07 NOTE — Telephone Encounter (Signed)
Notified pt and he voices understanding.  Lab order entered. Information mailed to pt and pt request.

## 2014-06-03 ENCOUNTER — Ambulatory Visit: Payer: BC Managed Care – PPO | Admitting: Family

## 2015-05-16 ENCOUNTER — Ambulatory Visit (INDEPENDENT_AMBULATORY_CARE_PROVIDER_SITE_OTHER): Payer: 59 | Admitting: Physician Assistant

## 2015-05-16 ENCOUNTER — Encounter: Payer: Self-pay | Admitting: Physician Assistant

## 2015-05-16 ENCOUNTER — Telehealth: Payer: Self-pay | Admitting: Family

## 2015-05-16 VITALS — BP 126/92 | HR 88 | Temp 98.4°F | Ht 70.5 in | Wt 255.4 lb

## 2015-05-16 DIAGNOSIS — K219 Gastro-esophageal reflux disease without esophagitis: Secondary | ICD-10-CM

## 2015-05-16 DIAGNOSIS — R739 Hyperglycemia, unspecified: Secondary | ICD-10-CM

## 2015-05-16 LAB — HEMOGLOBIN A1C: HEMOGLOBIN A1C: 6.7 % — AB (ref 4.6–6.5)

## 2015-05-16 MED ORDER — ATOMOXETINE HCL 25 MG PO CAPS: ORAL_CAPSULE | ORAL | Status: DC

## 2015-05-16 MED ORDER — RANITIDINE HCL 150 MG PO TABS: 150.0000 mg | ORAL_TABLET | Freq: Two times a day (BID) | ORAL | Status: DC

## 2015-05-16 MED ORDER — RANITIDINE HCL 150 MG PO TABS: 150.0000 mg | ORAL_TABLET | Freq: Two times a day (BID) | ORAL | Status: AC

## 2015-05-16 NOTE — Progress Notes (Signed)
   Patient presents to clinic today c/o nausea and abdominal discomfort with acid reflux after eating a large amount of pizza last night. Denies vomiting or change to bowel/bladder habits.  Denies fever, chills, malaise. Is not currently taking anything for acid reflux.  Past Medical History  Diagnosis Date  . ADD (attention deficit disorder)   . Hearing problem     as an infant.  ??tubes  . Overweight(278.02)     Current Outpatient Prescriptions on File Prior to Visit  Medication Sig Dispense Refill  . atomoxetine (STRATTERA) 25 MG capsule TAKE 1 CAPSULE EVERY DAY (Patient not taking: Reported on 05/16/2015) 30 capsule 5  . atorvastatin (LIPITOR) 10 MG tablet Take 1 tablet (10 mg total) by mouth daily. (Patient not taking: Reported on 05/16/2015) 30 tablet 2  . Omega-3 Fatty Acids (FISH OIL) 1000 MG CAPS Take 2,000 mg by mouth 2 (two) times daily.     No current facility-administered medications on file prior to visit.    Allergies  Allergen Reactions  . Penicillins     Allergy not confirmed. Pt's Dad is allergic to PCN.    Family History  Problem Relation Age of Onset  . Cancer Father     skin  . Fibromyalgia Father   . Chronic fatigue Father   . Arthritis Father     osteoarthritis    History   Social History  . Marital Status: Married    Spouse Name: N/A  . Number of Children: 0  . Years of Education: N/A   Occupational History  . IT assistant    Social History Main Topics  . Smoking status: Never Smoker   . Smokeless tobacco: Not on file  . Alcohol Use: Yes     Comment: rare  . Drug Use: No  . Sexual Activity: Not on file   Other Topics Concern  . None   Social History Narrative   Exercise: swimming 3 x weekly   Associates Degree         Review of Systems - See HPI.  All other ROS are negative.  BP 126/92 mmHg  Pulse 88  Temp(Src) 98.4 F (36.9 C) (Oral)  Ht 5' 10.5" (1.791 m)  Wt 255 lb 6.4 oz (115.849 kg)  BMI 36.12 kg/m2  SpO2  97%  Physical Exam  Constitutional: He is oriented to person, place, and time and well-developed, well-nourished, and in no distress.  HENT:  Head: Normocephalic and atraumatic.  Eyes: Conjunctivae are normal.  Neck: Neck supple.  Cardiovascular: Normal rate, regular rhythm, normal heart sounds and intact distal pulses.   Pulmonary/Chest: Effort normal and breath sounds normal. No respiratory distress. He has no wheezes. He has no rales. He exhibits no tenderness.  Abdominal: Soft. Bowel sounds are normal. He exhibits no distension and no mass. There is no tenderness. There is no rebound and no guarding.  Neurological: He is alert and oriented to person, place, and time.  Skin: Skin is warm and dry. No rash noted.  Psychiatric: Affect normal.  Vitals reviewed.    Assessment/Plan: Esophageal reflux Secondary to excessive intake yesterday evening. Rx Zantac BID over next few days. Dietary measures discussed. Follow-up if not resolving.

## 2015-05-16 NOTE — Telephone Encounter (Signed)
Marcus Arias-- please advise re: appt and Strattera Rx?

## 2015-05-16 NOTE — Telephone Encounter (Signed)
Pt was in to see Selena BattenCody and was advised to f/u with you for foster parenting forms/discussion and cpe. Does he need to do both cpe and discussion at same appt or 2 separate. He wants to discuss foster parenting asap. I put a 30 min on the schedule for 05/18/15 at 8:30 for this. 1st available CPE is 06/19/15 11:15am.   Please advise on how to schedule for the foster parenting discussion/forms and if you want 2 separate appts.  Pt also requesting refill on Strattera sent in today. He is out of this medication. [facility] [place] [address], pt has moved.

## 2015-05-16 NOTE — Telephone Encounter (Signed)
Cancelled rx previously sent to [facility] and re-sent to [address] location. Spoke with Sela Hildingeilia, she states they also received a zantac Rx from [place] from [place] office. She will cancel both Rxs. Rxs re-sent. Notified pt and cancelled 06/19/15 CPE appt.

## 2015-05-16 NOTE — Telephone Encounter (Signed)
I [facility]- is this the proper address?  He can do parenting forms and cpx in 1 visit.  Ok to combine two 15 minute slots for cpx please.

## 2015-05-16 NOTE — Assessment & Plan Note (Signed)
Secondary to excessive intake yesterday evening. Rx Zantac BID over next few days. Dietary measures discussed. Follow-up if not resolving.

## 2015-05-16 NOTE — Progress Notes (Signed)
Pre visit review using our clinic review tool, if applicable. No additional management support is needed unless otherwise documented below in the visit note. 

## 2015-05-16 NOTE — Patient Instructions (Signed)
Please stay well-hydrated and eat a bland diet. Avoid alcohol and spicy foods. Avoid late-night eating. Take Zantac twice daily as directed. Pepto bismol for nausea.   I will call you with your A1C results. [facility] and to discuss foster parenting forms.

## 2015-05-16 NOTE — Telephone Encounter (Signed)
Pharmacy address is [address] [place] [id number]

## 2015-05-17 ENCOUNTER — Ambulatory Visit: Payer: Self-pay | Admitting: Physician Assistant

## 2015-05-17 ENCOUNTER — Encounter: Payer: Self-pay | Admitting: Behavioral Health

## 2015-05-17 ENCOUNTER — Telehealth: Payer: Self-pay | Admitting: Behavioral Health

## 2015-05-17 NOTE — Telephone Encounter (Signed)
Pre-Visit Call completed with patient and chart updated.   Pre-Visit Info documented in Specialty Comments under [employer].    

## 2015-05-18 ENCOUNTER — Encounter: Payer: Self-pay | Admitting: Family

## 2015-05-18 ENCOUNTER — Ambulatory Visit (INDEPENDENT_AMBULATORY_CARE_PROVIDER_SITE_OTHER): Payer: 59 | Admitting: Family

## 2015-05-18 ENCOUNTER — Telehealth: Payer: Self-pay | Admitting: Family

## 2015-05-18 VITALS — BP 118/86 | HR 73 | Temp 97.6°F | Resp 18 | Ht 70.5 in | Wt 255.8 lb

## 2015-05-18 DIAGNOSIS — E119 Type 2 diabetes mellitus without complications: Secondary | ICD-10-CM

## 2015-05-18 DIAGNOSIS — F909 Attention-deficit hyperactivity disorder, unspecified type: Secondary | ICD-10-CM

## 2015-05-18 DIAGNOSIS — Z0000 Encounter for general adult medical examination without abnormal findings: Secondary | ICD-10-CM | POA: Diagnosis not present

## 2015-05-18 DIAGNOSIS — E781 Pure hyperglyceridemia: Secondary | ICD-10-CM | POA: Diagnosis not present

## 2015-05-18 DIAGNOSIS — R945 Abnormal results of liver function studies: Secondary | ICD-10-CM

## 2015-05-18 DIAGNOSIS — L304 Erythema intertrigo: Secondary | ICD-10-CM | POA: Insufficient documentation

## 2015-05-18 DIAGNOSIS — Z23 Encounter for immunization: Secondary | ICD-10-CM

## 2015-05-18 DIAGNOSIS — F988 Other specified behavioral and emotional disorders with onset usually occurring in childhood and adolescence: Secondary | ICD-10-CM

## 2015-05-18 DIAGNOSIS — R7989 Other specified abnormal findings of blood chemistry: Secondary | ICD-10-CM

## 2015-05-18 LAB — URINALYSIS, ROUTINE W REFLEX MICROSCOPIC
BILIRUBIN URINE: NEGATIVE
Hgb urine dipstick: NEGATIVE
Ketones, ur: NEGATIVE
LEUKOCYTES UA: NEGATIVE
NITRITE: NEGATIVE
RBC / HPF: NONE SEEN (ref 0–?)
Specific Gravity, Urine: 1.02 (ref 1.000–1.030)
Total Protein, Urine: NEGATIVE
Urine Glucose: NEGATIVE
Urobilinogen, UA: 1 (ref 0.0–1.0)
WBC, UA: NONE SEEN (ref 0–?)
pH: 6.5 (ref 5.0–8.0)

## 2015-05-18 LAB — CBC WITH DIFFERENTIAL/PLATELET
BASOS ABS: 0 10*3/uL (ref 0.0–0.1)
BASOS PCT: 0.5 % (ref 0.0–3.0)
Eosinophils Absolute: 0.1 10*3/uL (ref 0.0–0.7)
Eosinophils Relative: 1.5 % (ref 0.0–5.0)
HCT: 45.5 % (ref 39.0–52.0)
HEMOGLOBIN: 15.4 g/dL (ref 13.0–17.0)
Lymphocytes Relative: 35.6 % (ref 12.0–46.0)
Lymphs Abs: 2 10*3/uL (ref 0.7–4.0)
MCHC: 34 g/dL (ref 30.0–36.0)
MCV: 88.2 fl (ref 78.0–100.0)
MONOS PCT: 4.8 % (ref 3.0–12.0)
Monocytes Absolute: 0.3 10*3/uL (ref 0.1–1.0)
NEUTROS ABS: 3.3 10*3/uL (ref 1.4–7.7)
Neutrophils Relative %: 57.6 % (ref 43.0–77.0)
Platelets: 211 10*3/uL (ref 150.0–400.0)
RBC: 5.15 Mil/uL (ref 4.22–5.81)
RDW: 13.3 % (ref 11.5–15.5)
WBC: 5.7 10*3/uL (ref 4.0–10.5)

## 2015-05-18 LAB — MICROALBUMIN / CREATININE URINE RATIO
CREATININE, U: 197.8 mg/dL
MICROALB/CREAT RATIO: 0.9 mg/g (ref 0.0–30.0)
Microalb, Ur: 1.8 mg/dL (ref 0.0–1.9)

## 2015-05-18 LAB — HEPATIC FUNCTION PANEL
ALBUMIN: 4.1 g/dL (ref 3.5–5.2)
ALK PHOS: 76 U/L (ref 39–117)
ALT: 54 U/L — AB (ref 0–53)
AST: 32 U/L (ref 0–37)
BILIRUBIN DIRECT: 0.1 mg/dL (ref 0.0–0.3)
TOTAL PROTEIN: 7.3 g/dL (ref 6.0–8.3)
Total Bilirubin: 0.6 mg/dL (ref 0.2–1.2)

## 2015-05-18 LAB — BASIC METABOLIC PANEL
BUN: 14 mg/dL (ref 6–23)
CALCIUM: 9.2 mg/dL (ref 8.4–10.5)
CO2: 28 mEq/L (ref 19–32)
Chloride: 99 mEq/L (ref 96–112)
Creatinine, Ser: 0.92 mg/dL (ref 0.40–1.50)
GFR: 98.5 mL/min (ref >60.00)
GLUCOSE: 122 mg/dL — AB (ref 70–99)
POTASSIUM: 3.8 meq/L (ref 3.5–5.1)
SODIUM: 135 meq/L (ref 135–145)

## 2015-05-18 LAB — TSH: TSH: 2.29 u[IU]/mL (ref 0.35–4.50)

## 2015-05-18 LAB — LIPID PANEL
CHOL/HDL RATIO: 8
CHOLESTEROL: 198 mg/dL (ref 0–200)
HDL: 25.2 mg/dL — ABNORMAL LOW (ref >39.00)
Triglycerides: 735 mg/dL — ABNORMAL HIGH (ref 0.0–149.0)

## 2015-05-18 LAB — LDL CHOLESTEROL, DIRECT: Direct LDL: 76 mg/dL

## 2015-05-18 MED ORDER — ASPIRIN EC 81 MG PO TBEC: 81.0000 mg | DELAYED_RELEASE_TABLET | Freq: Every day | ORAL | Status: AC

## 2015-05-18 MED ORDER — NYSTATIN 100000 UNIT/GM EX CREA: 1.0000 "application " | TOPICAL_CREAM | Freq: Two times a day (BID) | CUTANEOUS | Status: DC

## 2015-05-18 MED ORDER — ATORVASTATIN CALCIUM 40 MG PO TABS: 40.0000 mg | ORAL_TABLET | Freq: Every day | ORAL | Status: DC

## 2015-05-18 NOTE — Progress Notes (Signed)
Pre visit review using our clinic review tool, if applicable. No additional management support is needed unless otherwise documented below in the visit note. 

## 2015-05-18 NOTE — Telephone Encounter (Signed)
Triglycerides are very high.  I recommend that he start atorvastatin 40 mg once daily and work on dietary changes we discussed at his visit.  LFT's elevated.  I suspect that this is due to fatty liver.  I would recommend that he complete an abdominal US to evaluate his liver.  Also add fish oil- otc 2000mg  bid. Recheck lipid panel and lft in 2 months.

## 2015-05-18 NOTE — Patient Instructions (Addendum)
Please add 30 minutes of exercise 5 days a week. Work on weight loss and diabetic diet. Schedule diabetic eye exam Add aspirin 81mg  once daily. Apply lotrimin spray or lotion (OTC) to both feet twice daily. Apply nystatin cream to fungal rash of groin twice daily Follow up in 3 months.  Diabetes Mellitus and Food It is important for you to manage your blood sugar (glucose) level. Your blood glucose level can be greatly affected by what you eat. Eating healthier foods in the appropriate amounts throughout the day at about the same time each day will help you control your blood glucose level. It can also help slow or prevent worsening of your diabetes mellitus. Healthy eating may even help you improve the level of your blood pressure and reach or maintain a healthy weight.  HOW CAN FOOD AFFECT ME? Carbohydrates Carbohydrates affect your blood glucose level more than any other type of food. Your dietitian will help you determine how many carbohydrates to eat at each meal and teach you how to count carbohydrates. Counting carbohydrates is important to keep your blood glucose at a healthy level, especially if you are using insulin or taking certain medicines for diabetes mellitus. Alcohol Alcohol can cause sudden decreases in blood glucose (hypoglycemia), especially if you use insulin or take certain medicines for diabetes mellitus. Hypoglycemia can be a life-threatening condition. Symptoms of hypoglycemia (sleepiness, dizziness, and disorientation) are similar to symptoms of having too much alcohol.  If your health care provider has given you approval to drink alcohol, do so in moderation and use the following guidelines:  Women should not have more than one drink per day, and men should not have more than two drinks per day. One drink is equal to:  12 oz of beer.  5 oz of wine.  1 oz of hard liquor.  Do not drink on an empty stomach.  Keep yourself hydrated. Have water, diet soda, or  unsweetened iced tea.  Regular soda, juice, and other mixers might contain a lot of carbohydrates and should be counted. WHAT FOODS ARE NOT RECOMMENDED? As you make food choices, it is important to remember that all foods are not the same. Some foods have fewer nutrients per serving than other foods, even though they might have the same number of calories or carbohydrates. It is difficult to get your body what it needs when you eat foods with fewer nutrients. Examples of foods that you should avoid that are high in calories and carbohydrates but low in nutrients include:  Trans fats (most processed foods list trans fats on the Nutrition Facts label).  Regular soda.  Juice.  Candy.  Sweets, such as cake, pie, doughnuts, and cookies.  Fried foods. WHAT FOODS CAN I EAT? Have nutrient-rich foods, which will nourish your body and keep you healthy. The food you should eat also will depend on several factors, including:  The calories you need.  The medicines you take.  Your weight.  Your blood glucose level.  Your blood pressure level.  Your cholesterol level. You also should eat a variety of foods, including:  Protein, such as meat, poultry, fish, tofu, nuts, and seeds (lean animal proteins are best).  Fruits.  Vegetables.  Dairy products, such as milk, cheese, and yogurt (low fat is best).  Breads, grains, pasta, cereal, rice, and beans.  Fats such as olive oil, trans fat-free margarine, canola oil, avocado, and olives. DOES EVERYONE WITH DIABETES MELLITUS HAVE THE SAME MEAL PLAN? Because every person with diabetes  mellitus is different, there is not one meal plan that works for everyone. It is very important that you meet with a dietitian who will help you create a meal plan that is just right for you. Document Released: 07/25/2005 Document Revised: 11/02/2013 Document Reviewed: 09/24/2013 [facility] Patient Information 2015 [place], [place]. This information is not intended  to replace advice given to you by your health care provider. Make sure you discuss any questions you have with your health care provider.

## 2015-05-18 NOTE — Assessment & Plan Note (Signed)
New diagnosis for pt. Pt counseled on diabetic diet, foot care, weight loss, add asa, check urine microalbumin, pneumovax today.

## 2015-05-18 NOTE — Assessment & Plan Note (Signed)
Td today, advised follow up eye exam and dental exam, diet, exercise, weight loss. Routine labs.

## 2015-05-18 NOTE — Progress Notes (Signed)
Subjective:    Patient ID: Marcus Arias, male    DOB: 11/29/1975, 39 y.o.   MRN: [id number]  HPI   Patient presents today for complete physical.  Immunizations: due for tetanus, pneumovas Diet: not healthy Exercise: not currently Dental: due Vision: last eye exam >1 yr ago.   Would like to become a foster parent  Hyperlipidemia- stopped statin, not sure why he stopped the med.    ADHD- reports that he is doing well on strattera.  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     Review of Systems  Constitutional:       Wt Readings from Last 3 Encounters: 05/18/15 : 255 lb 12.8 oz (116.03 kg) 05/16/15 : 255 lb 6.4 oz (115.849 kg) 12/03/13 : 247 lb 3.2 oz (112.129 kg)    HENT: Negative for rhinorrhea.   Respiratory: Negative for cough and shortness of breath.   Cardiovascular: Negative for chest pain and leg swelling.  Gastrointestinal: Negative for nausea, vomiting and diarrhea.       Nausea 7/4- resolved  Genitourinary: Negative for dysuria and frequency.  Musculoskeletal: Negative for arthralgias.       Some left anterior thigh tenderness  Skin:       Reports "yeast infection" inner thighs  Neurological: Negative for headaches.  Hematological: Negative for adenopathy.  Psychiatric/Behavioral:       Denies depression or anxiety   Past Medical History  Diagnosis Date  . ADD (attention deficit disorder)   . Hearing problem     as an infant.  ??tubes  . Overweight(278.02)     History   Social History  . Marital Status: Married    Spouse Name: N/A  . Number of Children: 0  . Years of Education: N/A   Occupational History  . IT assistant    Social History Main Topics  . Smoking status: Never Smoker   . Smokeless tobacco: Not on file  . Alcohol Use: Yes     Comment: rare  . Drug Use: No  . Sexual Activity: Not on file   Other Topics Concern  . Not on file   Social History Narrative   Exercise: swimming 3 x weekly   Associates Degree           Past Surgical History  Procedure Laterality Date  . Hair transplant  2009    Family History  Problem Relation Age of Onset  . Cancer Father     skin  . Fibromyalgia Father   . Chronic fatigue Father   . Arthritis Father     osteoarthritis    Allergies  Allergen Reactions  . Penicillins     Allergy not confirmed. Pt's Dad is allergic to PCN.    Current Outpatient Prescriptions on File Prior to Visit  Medication Sig Dispense Refill  . atomoxetine (STRATTERA) 25 MG capsule TAKE 1 CAPSULE EVERY DAY 30 capsule 5  . ranitidine (ZANTAC) 150 MG tablet Take 1 tablet (150 mg total) by mouth 2 (two) times daily. 30 tablet 0  . atorvastatin (LIPITOR) 10 MG tablet Take 1 tablet (10 mg total) by mouth daily. (Patient not taking: Reported on 05/16/2015) 30 tablet 2   No current facility-administered medications on file prior to visit.    BP 118/86 mmHg  Pulse 73  Temp(Src) 97.6 F (36.4 C) (Oral)  Resp 18  Ht 5' 10.5" (1.791 m)  Wt 255 lb 12.8 oz (116.03 kg)  BMI 36.17 kg/m2  SpO2 97%  Objective:   Physical Exam  Physical Exam  Constitutional: He is oriented to person, place, and time. He appears well-developed and well-nourished. No distress.  HENT:  Head: Normocephalic and atraumatic.  Right Ear: Tympanic membrane and ear canal normal.  Left Ear: Tympanic membrane and ear canal normal.  Mouth/Throat: Oropharynx is clear and moist.  Eyes: Pupils are equal, round, and reactive to light. No scleral icterus.  Neck: Normal range of motion. No thyromegaly present.  Cardiovascular: Normal rate and regular rhythm.   No murmur heard. Pulmonary/Chest: Effort normal and breath sounds normal. No respiratory distress. He has no wheezes. He has no rales. He exhibits no tenderness.  Abdominal: Soft. Bowel sounds are normal. He exhibits no distension and no mass. There is no tenderness. There is no rebound and no guarding.  Musculoskeletal: He exhibits no edema.  Lymphadenopathy:     He has no cervical adenopathy.  Neurological: He is alert and oriented to person, place, and time. He has normal reflexes. He exhibits normal muscle tone. Coordination normal.  Skin: Skin is warm and dry. + pink rash noted bilateral groin, dry peeling skin bilateral feet Psychiatric: He has a normal mood and affect. His behavior is normal. Judgment and thought content normal.          Assessment & Plan:         Assessment & Plan:

## 2015-05-18 NOTE — Assessment & Plan Note (Signed)
Stable on strattera, continue same.

## 2015-05-18 NOTE — Assessment & Plan Note (Signed)
Not on statin, will likely need to resume, obtain flp.

## 2015-05-18 NOTE — Assessment & Plan Note (Signed)
Advised nystatin cream for groin, and otc lamisil for feet.

## 2015-05-18 NOTE — Telephone Encounter (Signed)
Notified pt and he voices understanding and is agreeable to proceed with u/s. Lab appt scheduled for 07/19/15 at 7am.  Future lab orders entered. Rx sent.

## 2015-05-19 ENCOUNTER — Other Ambulatory Visit: Payer: Self-pay | Admitting: Family

## 2015-05-26 ENCOUNTER — Encounter: Payer: Self-pay | Admitting: Family

## 2015-05-26 ENCOUNTER — Ambulatory Visit (HOSPITAL_BASED_OUTPATIENT_CLINIC_OR_DEPARTMENT_OTHER)
Admission: RE | Admit: 2015-05-26 | Discharge: 2015-05-26 | Disposition: A | Discharge status: Home / Self Care | Payer: 59 | Source: Ambulatory Visit | Attending: Family | Admitting: Family

## 2015-05-26 ENCOUNTER — Telehealth: Payer: Self-pay | Admitting: Family

## 2015-05-26 DIAGNOSIS — R945 Abnormal results of liver function studies: Secondary | ICD-10-CM | POA: Diagnosis present

## 2015-05-26 DIAGNOSIS — K760 Fatty (change of) liver, not elsewhere classified: Secondary | ICD-10-CM | POA: Diagnosis not present

## 2015-05-26 HISTORY — DX: Fatty (change of) liver, not elsewhere classified: K76.0

## 2015-05-26 NOTE — Telephone Encounter (Signed)
Pt notified and made aware.  No further concerns voiced.  

## 2015-05-26 NOTE — Telephone Encounter (Signed)
US shows fatty liver.  Advise low fat/low cholesterol diet, exercise, weight loss.

## 2015-06-19 ENCOUNTER — Encounter: Payer: 59 | Admitting: Family

## 2015-07-19 ENCOUNTER — Other Ambulatory Visit: Payer: 59

## 2015-07-21 ENCOUNTER — Other Ambulatory Visit (INDEPENDENT_AMBULATORY_CARE_PROVIDER_SITE_OTHER): Payer: 59

## 2015-07-21 DIAGNOSIS — R7989 Other specified abnormal findings of blood chemistry: Secondary | ICD-10-CM | POA: Diagnosis not present

## 2015-07-21 DIAGNOSIS — E781 Pure hyperglyceridemia: Secondary | ICD-10-CM | POA: Diagnosis not present

## 2015-07-21 DIAGNOSIS — R945 Abnormal results of liver function studies: Secondary | ICD-10-CM

## 2015-07-21 LAB — LIPID PANEL
CHOLESTEROL: 189 mg/dL (ref 0–200)
HDL: 27.2 mg/dL — ABNORMAL LOW (ref >39.00)
Total CHOL/HDL Ratio: 7
Triglycerides: 675 mg/dL — ABNORMAL HIGH (ref 0.0–149.0)

## 2015-07-21 LAB — HEPATIC FUNCTION PANEL
ALBUMIN: 4.5 g/dL (ref 3.5–5.2)
ALT: 48 U/L (ref 0–53)
AST: 23 U/L (ref 0–37)
Alkaline Phosphatase: 95 U/L (ref 39–117)
Bilirubin, Direct: 0.1 mg/dL (ref 0.0–0.3)
TOTAL PROTEIN: 7.8 g/dL (ref 6.0–8.3)
Total Bilirubin: 0.6 mg/dL (ref 0.2–1.2)

## 2015-07-21 LAB — LDL CHOLESTEROL, DIRECT: LDL DIRECT: 82 mg/dL

## 2015-07-22 ENCOUNTER — Telehealth: Payer: Self-pay | Admitting: Family

## 2015-07-22 DIAGNOSIS — E781 Pure hyperglyceridemia: Secondary | ICD-10-CM

## 2015-07-22 MED ORDER — FENOFIBRATE 145 MG PO TABS: 145.0000 mg | ORAL_TABLET | Freq: Every day | ORAL | Status: DC

## 2015-07-22 NOTE — Telephone Encounter (Signed)
Please notify pt that trigs are very high.  I would like him to add tricor and work on avoiding concentrated sweets, and limiting white carbs (rice/bread/pasta/potatoes). Instead substitute whole grain versions with reasonable portions. Repeat lipids in 3 months, dx hypertriglyceridemia.

## 2015-07-25 NOTE — Telephone Encounter (Signed)
Left detailed message on pt's cell# and to call and schedule fasting lab appt in 3 months. Future lab order entered.

## 2015-07-31 ENCOUNTER — Telehealth: Payer: Self-pay | Admitting: Family

## 2015-07-31 NOTE — Telephone Encounter (Signed)
OK 

## 2015-07-31 NOTE — Telephone Encounter (Signed)
Pt states insurance will cover 160 mg.

## 2015-07-31 NOTE — Telephone Encounter (Signed)
Relation to ZO:XWRU Call back number:[phone]   Reason for call:  As per patient would like to discuss fenofibrate (TRICOR) 145 MG tablet patient states insurance will cover if MG increase

## 2015-07-31 NOTE — Telephone Encounter (Signed)
Left message for pt to call with dosage that insurance will cover.

## 2015-07-31 NOTE — Telephone Encounter (Signed)
Melissa--please advise if ok to change tricor strength to  since that is what insurance will cover?

## 2015-08-01 MED ORDER — FENOFIBRATE 160 MG PO TABS: 160.0000 mg | ORAL_TABLET | Freq: Every day | ORAL | Status: DC

## 2015-08-01 NOTE — Telephone Encounter (Signed)
Rx sent with new strength. Left detailed message re: Rx completion on pt's voicemail.

## 2015-08-23 ENCOUNTER — Ambulatory Visit: Payer: 59 | Admitting: Family

## 2015-08-25 ENCOUNTER — Ambulatory Visit (INDEPENDENT_AMBULATORY_CARE_PROVIDER_SITE_OTHER): Payer: 59 | Admitting: Family

## 2015-08-25 ENCOUNTER — Encounter: Payer: Self-pay | Admitting: Family

## 2015-08-25 VITALS — BP 126/78 | HR 80 | Temp 98.1°F | Resp 16 | Ht 70.5 in | Wt 254.2 lb

## 2015-08-25 DIAGNOSIS — E1310 Other specified diabetes mellitus with ketoacidosis without coma: Secondary | ICD-10-CM

## 2015-08-25 DIAGNOSIS — E781 Pure hyperglyceridemia: Secondary | ICD-10-CM

## 2015-08-25 DIAGNOSIS — E785 Hyperlipidemia, unspecified: Secondary | ICD-10-CM

## 2015-08-25 DIAGNOSIS — F988 Other specified behavioral and emotional disorders with onset usually occurring in childhood and adolescence: Secondary | ICD-10-CM

## 2015-08-25 DIAGNOSIS — F909 Attention-deficit hyperactivity disorder, unspecified type: Secondary | ICD-10-CM

## 2015-08-25 DIAGNOSIS — E119 Type 2 diabetes mellitus without complications: Secondary | ICD-10-CM

## 2015-08-25 DIAGNOSIS — E1110 Type 2 diabetes mellitus with ketoacidosis without coma: Secondary | ICD-10-CM

## 2015-08-25 LAB — LIPID PANEL
CHOL/HDL RATIO: 7
CHOLESTEROL: 175 mg/dL (ref 0–200)
HDL: 26.4 mg/dL — AB (ref >39.00)

## 2015-08-25 LAB — HEMOGLOBIN A1C: Hgb A1c MFr Bld: 7.1 % — ABNORMAL HIGH (ref 4.6–6.5)

## 2015-08-25 LAB — LDL CHOLESTEROL, DIRECT: Direct LDL: 80 mg/dL

## 2015-08-25 MED ORDER — GLUCOSE BLOOD VI STRP: ORAL_STRIP | Status: AC

## 2015-08-25 MED ORDER — ATOMOXETINE HCL 40 MG PO CAPS: 40.0000 mg | ORAL_CAPSULE | Freq: Every day | ORAL | Status: DC

## 2015-08-25 MED ORDER — ATORVASTATIN CALCIUM 40 MG PO TABS: 40.0000 mg | ORAL_TABLET | Freq: Every day | ORAL | Status: DC

## 2015-08-25 NOTE — Progress Notes (Signed)
Pre visit review using our clinic review tool, if applicable. No additional management support is needed unless otherwise documented below in the visit note. 

## 2015-08-25 NOTE — Assessment & Plan Note (Signed)
Uncontrolled. Increase strattera to 40mg .

## 2015-08-25 NOTE — Progress Notes (Signed)
Subjective:    Patient ID: Marcus Arias, male    DOB: July 27, 1976, 39 y.o.   MRN: [id number]  HPI  Marcus Arias is a 39 yr old male who presents today for follow up.  1) DM2- maintained on diabetic diet. He reports that he struggles with the diabetic diet.   Lab Results  Component Value Date   HGBA1C 6.7* 05/16/2015   2)  Hyperlipidemia-  Lab Results  Component Value Date   CHOL 189 07/21/2015   HDL 27.20* 07/21/2015   LDLCALC  12/03/2013     Comment:       Not calculated due to Triglyceride >400. Suggest ordering Direct LDL (Unit Code: [id number]).   Total Cholesterol/HDL Ratio:CHD Risk                        Coronary Heart Disease Risk Table                                        Men       Women          1/2 Average Risk              3.4        3.3              Average Risk              5.0        4.4           2X Average Risk              9.6        7.1           3X Average Risk             23.4       11.0 Use the calculated Patient Ratio above and the CHD Risk table  to determine the patient's CHD Risk. ATP III Classification (LDL):       < 100        mg/dL         Optimal      191100 - 129     mg/dL         Near or Above Optimal      130 - 159     mg/dL         Borderline High      160 - 189     mg/dL         High       > 478190        mg/dL         Very High     LDLDIRECT 82.0 07/21/2015   TRIG * 07/21/2015    675.0 Triglyceride is over 400; calculations on Lipids are invalid.   CHOLHDL 7 07/21/2015   Maintained on fenofibrate.  3) ADD- maintained on strattera. Reports that this it is not working as well as it was for him.     Review of Systems  Genitourinary:       Reports episode of RUQ pain last week , was worse with walking.  Went to fast med.  Symptoms resolved.     Past Medical History  Diagnosis Date  . ADD (attention deficit disorder)   . Hearing problem     as an infant.  ??  tubes  . Overweight(278.02)   . Fatty liver 05/26/2015    Social History    Social History  . Marital Status: Married    Spouse Name: N/A  . Number of Children: 0  . Years of Education: N/A   Occupational History  . IT assistant    Social History Main Topics  . Smoking status: Never Smoker   . Smokeless tobacco: Not on file  . Alcohol Use: Yes     Comment: rare  . Drug Use: No  . Sexual Activity: Not on file   Other Topics Concern  . Not on file   Social History Narrative   Exercise: swimming 3 x weekly   Associates Degree          Past Surgical History  Procedure Laterality Date  . Hair transplant  2009    Family History  Problem Relation Age of Onset  . Cancer Father     skin  . Fibromyalgia Father   . Chronic fatigue Father   . Arthritis Father     osteoarthritis    Allergies  Allergen Reactions  . Penicillins     Allergy not confirmed. Pt's Dad is allergic to PCN.    Current Outpatient Prescriptions on File Prior to Visit  Medication Sig Dispense Refill  . atomoxetine (STRATTERA) 25 MG capsule TAKE 1 CAPSULE EVERY DAY 30 capsule 5  . atorvastatin (LIPITOR) 40 MG tablet Take 1 tablet (40 mg total) by mouth daily. 30 tablet 3  . fenofibrate 160 MG tablet Take 1 tablet (160 mg total) by mouth daily. 30 tablet 5  . nystatin cream (MYCOSTATIN) Apply 1 application topically 2 (two) times daily. To thighs as needed. 30 g 1  . Omega-3 Fatty Acids (FISH OIL) 1000 MG CAPS Take 2,000 mg by mouth 2 (two) times daily.    . ranitidine (ZANTAC) 150 MG tablet Take 1 tablet (150 mg total) by mouth 2 (two) times daily. 30 tablet 0  . aspirin EC 81 MG tablet Take 1 tablet (81 mg total) by mouth daily. (Patient not taking: Reported on 08/25/2015)     No current facility-administered medications on file prior to visit.    BP 126/78 mmHg  Pulse 80  Temp(Src) 98.1 F (36.7 C) (Oral)  Resp 16  Ht 5' 10.5" (1.791 m)  Wt 254 lb 3.2 oz (115.304 kg)  BMI 35.95 kg/m2  SpO2 97%        Objective:   Physical Exam  Constitutional: He is  oriented to person, place, and time. He appears well-developed and well-nourished. No distress.  HENT:  Head: Normocephalic and atraumatic.  Cardiovascular: Normal rate and regular rhythm.   No murmur heard. Pulmonary/Chest: Effort normal and breath sounds normal. No respiratory distress. He has no wheezes. He has no rales.  Musculoskeletal: He exhibits no edema.  Neurological: He is alert and oriented to person, place, and time.  Skin: Skin is warm and dry.  Psychiatric: He has a normal mood and affect. His behavior is normal. Thought content normal.          Assessment & Plan:

## 2015-08-25 NOTE — Assessment & Plan Note (Signed)
Discussed diet, exercise, weight loss.  Refer to diabetes educator- pt given glucose meter to bring with him to his appointment. Flu shot up to date. Refer for eye exam. Advised otc lamisil for tinea pedis.

## 2015-08-25 NOTE — Patient Instructions (Signed)
You will be contacted about your referral to the diabetes counselor and the eye doctor. Increase strattera to 40mg  once daily. Complete lab work prior to leaving.

## 2015-08-25 NOTE — Assessment & Plan Note (Signed)
Discussed diet, continue tricor and statin, obtain flp.

## 2015-08-26 ENCOUNTER — Telehealth: Payer: Self-pay | Admitting: Family

## 2015-08-26 MED ORDER — METFORMIN HCL 500 MG PO TABS: 500.0000 mg | ORAL_TABLET | Freq: Two times a day (BID) | ORAL | Status: DC

## 2015-08-26 NOTE — Telephone Encounter (Signed)
A1C above goal 7.1 and trigs still high.  I would like him to add metformin bid. This will help lower sugar and trigs.  Continue work on diet, exercise, weight loss.

## 2015-08-28 NOTE — Telephone Encounter (Signed)
Notified pt and he voices understanding. 

## 2015-12-01 ENCOUNTER — Ambulatory Visit: Payer: 59 | Admitting: Family

## 2015-12-12 ENCOUNTER — Ambulatory Visit (INDEPENDENT_AMBULATORY_CARE_PROVIDER_SITE_OTHER): Payer: 59 | Admitting: Medical

## 2015-12-12 ENCOUNTER — Encounter: Payer: Self-pay | Admitting: Medical

## 2015-12-12 ENCOUNTER — Ambulatory Visit (HOSPITAL_BASED_OUTPATIENT_CLINIC_OR_DEPARTMENT_OTHER)
Admission: RE | Admit: 2015-12-12 | Discharge: 2015-12-12 | Disposition: A | Discharge status: Home / Self Care | Payer: 59 | Source: Ambulatory Visit | Attending: Medical | Admitting: Medical

## 2015-12-12 VITALS — BP 120/80 | HR 77 | Temp 98.6°F | Ht 70.5 in | Wt 252.2 lb

## 2015-12-12 DIAGNOSIS — R062 Wheezing: Secondary | ICD-10-CM

## 2015-12-12 DIAGNOSIS — J209 Acute bronchitis, unspecified: Secondary | ICD-10-CM

## 2015-12-12 DIAGNOSIS — R05 Cough: Secondary | ICD-10-CM | POA: Diagnosis not present

## 2015-12-12 DIAGNOSIS — R059 Cough, unspecified: Secondary | ICD-10-CM

## 2015-12-12 DIAGNOSIS — R918 Other nonspecific abnormal finding of lung field: Secondary | ICD-10-CM | POA: Diagnosis not present

## 2015-12-12 DIAGNOSIS — H6692 Otitis media, unspecified, left ear: Secondary | ICD-10-CM

## 2015-12-12 DIAGNOSIS — J029 Acute pharyngitis, unspecified: Secondary | ICD-10-CM

## 2015-12-12 MED ORDER — FLUTICASONE PROPIONATE 50 MCG/ACT NA SUSP: 2.0000 | Freq: Every day | NASAL | Status: AC

## 2015-12-12 MED ORDER — AZITHROMYCIN 250 MG PO TABS: ORAL_TABLET | ORAL | Status: DC

## 2015-12-12 MED ORDER — HYDROCODONE-HOMATROPINE 5-1.5 MG/5ML PO SYRP: 5.0000 mL | ORAL_SOLUTION | Freq: Three times a day (TID) | ORAL | Status: DC | PRN

## 2015-12-12 MED ORDER — ALBUTEROL SULFATE HFA 108 (90 BASE) MCG/ACT IN AERS: 2.0000 | INHALATION_SPRAY | Freq: Four times a day (QID) | RESPIRATORY_TRACT | Status: AC | PRN

## 2015-12-12 NOTE — Patient Instructions (Addendum)
You appear to have bronchitis(concern for lt ear infection as well). Rest hydrate and tylenol for fever. I am prescribing cough medicine hycodan, and azithromycin  antibiotic. For your nasal congestion rx flonase.  Based on lung exam today would like to get chest xray today  For wheezing prescription of albuterol  Follow up in 7-10 days or as needed

## 2015-12-12 NOTE — Progress Notes (Signed)
Subjective:    Patient ID: Marcus Arias, male    DOB: 27-Oct-1976, 40 y.o.   MRN: [id number]  HPI  Pt in for cough for 2 weeks. It is progressively getting worse. Pt states cough is productive. Some hoarse voice. Mild left submandibular area tender. 1 week ago some fever chills and sweats.(also had some body aches a week ago.)  Some nasal congestion and some left ear pressure.   Pt is coughing at night and keeping wife up.  Pt has some wheezing at night recently. 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   Review of Systems  Constitutional: Negative for fever, chills and fatigue.  HENT: Positive for congestion and sore throat. Negative for ear pain, postnasal drip and sinus pressure.   Respiratory: Positive for cough. Negative for choking, chest tightness, shortness of breath and wheezing.        Productive cough.  Cardiovascular: Negative for chest pain and palpitations.  Neurological: Negative for dizziness and headaches.  Hematological: Negative for adenopathy. Does not bruise/bleed easily.  Psychiatric/Behavioral: Negative for behavioral problems and confusion.    Past Medical History  Diagnosis Date  . ADD (attention deficit disorder)   . Hearing problem     as an infant.  ??tubes  . Overweight(278.02)   . Fatty liver 05/26/2015    Social History   Social History  . Marital Status: Married    Spouse Name: N/A  . Number of Children: 0  . Years of Education: N/A   Occupational History  . IT assistant    Social History Main Topics  . Smoking status: Never Smoker   . Smokeless tobacco: Not on file  . Alcohol Use: Yes     Comment: rare  . Drug Use: No  . Sexual Activity: Not on file   Other Topics Concern  . Not on file   Social History Narrative   Exercise: swimming 3 x weekly   Associates Degree          Past Surgical History  Procedure Laterality Date  . Hair transplant  2009    Family History  Problem Relation Age of Onset  . Cancer  Father     skin  . Fibromyalgia Father   . Chronic fatigue Father   . Arthritis Father     osteoarthritis    Allergies  Allergen Reactions  . Penicillins     Allergy not confirmed. Pt's Dad is allergic to PCN.    Current Outpatient Prescriptions on File Prior to Visit  Medication Sig Dispense Refill  . aspirin EC 81 MG tablet Take 1 tablet (81 mg total) by mouth daily.    Marland Kitchen atomoxetine (STRATTERA) 40 MG capsule Take 1 capsule (40 mg total) by mouth daily. 30 capsule 5  . atorvastatin (LIPITOR) 40 MG tablet Take 1 tablet (40 mg total) by mouth daily. 30 tablet 5  . fenofibrate 160 MG tablet Take 1 tablet (160 mg total) by mouth daily. 30 tablet 5  . glucose blood (ONETOUCH VERIO) test strip Use as instructed to check blood sugar once a day. 100 each 1  . metFORMIN (GLUCOPHAGE) 500 MG tablet Take 1 tablet (500 mg total) by mouth 2 (two) times daily with a meal. 60 tablet 3  . nystatin cream (MYCOSTATIN) Apply 1 application topically 2 (two) times daily. To thighs as needed. 30 g 1  . Omega-3 Fatty Acids (FISH OIL) 1000 MG CAPS Take 2,000 mg by mouth 2 (two) times daily.    Marland Kitchen  ranitidine (ZANTAC) 150 MG tablet Take 1 tablet (150 mg total) by mouth 2 (two) times daily. 30 tablet 0   No current facility-administered medications on file prior to visit.    BP 120/80 mmHg  Pulse 77  Temp(Src) 98.6 F (37 C) (Oral)  Ht 5' 10.5" (1.791 m)  Wt 252 lb 3.2 oz (114.397 kg)  BMI 35.66 kg/m2  SpO2 98%       Objective:   Physical Exam  General  Mental Status - Alert. General Appearance - Well groomed. Not in acute distress.  Skin Rashes- No Rashes.  HEENT Head- Normal. Ear Auditory Canal - Left- wax blocking view of left tm Partial seen upper portion and does look bright red Right - Normal.Tympanic Membrane- Left- Normal. Right- Normal. Eye Sclera/Conjunctiva- Left- Normal. Right- Normal. Nose & Sinuses Nasal Mucosa- Left-  Boggy and Congested. Right-  Boggy and   Congested.Bilateral  No maxillary and  No frontal sinus pressure. Mouth & Throat Lips: Upper Lip- Normal: no dryness, cracking, pallor, cyanosis, or vesicular eruption. Lower Lip-Normal: no dryness, cracking, pallor, cyanosis or vesicular eruption. Buccal Mucosa- Bilateral- No Aphthous ulcers. Oropharynx- No Discharge but mild Erythema. Tonsils: Characteristics- Bilateral- No Erythema or Congestion. Size/Enlargement- Bilateral- No enlargement. Discharge- bilateral-None.  Neck Neck- Supple. No Masses.   Chest and Lung Exam Auscultation: Breath Sounds:- even and unlabored. But rhonchi both lung fields. Worse on left side.  Cardiovascular Auscultation:Rythm- Regular, rate and rhythm. Murmurs & Other Heart Sounds:Ausculatation of the heart reveal- No Murmurs.  Lymphatic Head & Neck General Head & Neck Lymphatics: Bilateral: Description- No Localized lymphadenopathy.       Assessment & Plan:  You appear to have bronchitis(concern for lt ear infection as well). Rest hydrate and tylenol for fever. I am prescribing cough medicine hycodan, and azithromycin  antibiotic. For your nasal congestion rx flonase.  Based on lung exam today would like to get chest xray today  For wheezing prescription of albuterol  Follow up in 7-10 days or as needed

## 2015-12-12 NOTE — Progress Notes (Signed)
Pre visit review using our clinic review tool, if applicable. No additional management support is needed unless otherwise documented below in the visit note. 

## 2015-12-13 ENCOUNTER — Telehealth: Payer: Self-pay | Admitting: Family

## 2015-12-13 ENCOUNTER — Telehealth: Payer: Self-pay | Admitting: Medical

## 2015-12-13 NOTE — Telephone Encounter (Signed)
Pt needed letter changed from 12/07/15 to 12/04/15. Provider give ok to change dates.

## 2015-12-13 NOTE — Telephone Encounter (Signed)
Letter printed and forwarded to provider for signature. Pt requested that diagnoses be put in the letter for work protocol and PCP gave ok to put diagnosis in at pt request. Pt will be notified by front office that letter is ready for pick up.

## 2015-12-13 NOTE — Telephone Encounter (Signed)
Pt needs a work note out since 12-07-2015 and returning on December 18, 2015. That is ok. But clarify with him if he wants [place] to specify that he has pneumonia. I usually don't get that specific on notes. Will you write the note for me and document he wants [place] to put pneumonia dx down if that is his request.

## 2015-12-13 NOTE — Telephone Encounter (Signed)
Marcus Arias please advise and I can write a letter.

## 2015-12-13 NOTE — Telephone Encounter (Signed)
Caller name: Avan   Relationship to patient: Self   Can be reached: [phone]  Reason for call: Pt says that he was seen by Ramon Dredge and Dx with Pneumonia. He would like to get a work note excusing him from work on Last Monday & Tuesday and also Tuesday - Friday this week.     Pt would like to pick up today if possible.    Thanks.

## 2015-12-13 NOTE — Telephone Encounter (Signed)
Called pt and informed letter was at front desk ready to pick up. Pt stated is on his way to pick up.

## 2015-12-13 NOTE — Telephone Encounter (Signed)
Pt asking if he can get a work note from 1/26 &1/27 being sick & DX with pneumonia 12/12/15. He is out of work today and would like to be out Thrusday and Friday. Can he get a letter writing him out all these days? Please call pt to advise [phone]. Please include the dx on the letter for his company.

## 2015-12-20 ENCOUNTER — Ambulatory Visit: Payer: 59 | Admitting: Medical

## 2016-02-05 ENCOUNTER — Ambulatory Visit: Payer: 59 | Admitting: Family Medicine

## 2016-02-05 ENCOUNTER — Other Ambulatory Visit: Payer: Self-pay | Admitting: Family Medicine

## 2016-02-05 ENCOUNTER — Ambulatory Visit
Admission: RE | Admit: 2016-02-05 | Discharge: 2016-02-05 | Disposition: A | Discharge status: Home / Self Care | Payer: 59 | Source: Ambulatory Visit | Attending: Family Medicine | Admitting: Family Medicine

## 2016-02-05 ENCOUNTER — Encounter: Payer: Self-pay | Admitting: Family Medicine

## 2016-02-05 ENCOUNTER — Ambulatory Visit (INDEPENDENT_AMBULATORY_CARE_PROVIDER_SITE_OTHER): Payer: 59 | Admitting: Family Medicine

## 2016-02-05 VITALS — BP 120/82 | HR 92 | Temp 98.4°F | Ht 70.0 in | Wt 246.6 lb

## 2016-02-05 DIAGNOSIS — F988 Other specified behavioral and emotional disorders with onset usually occurring in childhood and adolescence: Secondary | ICD-10-CM

## 2016-02-05 DIAGNOSIS — E781 Pure hyperglyceridemia: Secondary | ICD-10-CM

## 2016-02-05 DIAGNOSIS — F909 Attention-deficit hyperactivity disorder, unspecified type: Secondary | ICD-10-CM | POA: Diagnosis not present

## 2016-02-05 DIAGNOSIS — R059 Cough, unspecified: Secondary | ICD-10-CM

## 2016-02-05 DIAGNOSIS — R05 Cough: Secondary | ICD-10-CM | POA: Diagnosis not present

## 2016-02-05 DIAGNOSIS — E119 Type 2 diabetes mellitus without complications: Secondary | ICD-10-CM

## 2016-02-05 DIAGNOSIS — J189 Pneumonia, unspecified organism: Secondary | ICD-10-CM

## 2016-02-05 LAB — LIPID PANEL
CHOL/HDL RATIO: 7
CHOLESTEROL: 185 mg/dL (ref 0–200)
HDL: 25.9 mg/dL — ABNORMAL LOW (ref >39.00)
Triglycerides: 434 mg/dL — ABNORMAL HIGH (ref 0.0–149.0)

## 2016-02-05 LAB — COMPREHENSIVE METABOLIC PANEL
ALK PHOS: 88 U/L (ref 39–117)
ALT: 60 U/L — ABNORMAL HIGH (ref 0–53)
AST: 31 U/L (ref 0–37)
Albumin: 4.4 g/dL (ref 3.5–5.2)
BUN: 9 mg/dL (ref 6–23)
CHLORIDE: 103 meq/L (ref 96–112)
CO2: 26 mEq/L (ref 19–32)
Calcium: 9.3 mg/dL (ref 8.4–10.5)
Creatinine, Ser: 0.9 mg/dL (ref 0.40–1.50)
GFR: 100.63 mL/min (ref >60.00)
GLUCOSE: 180 mg/dL — AB (ref 70–99)
POTASSIUM: 4.1 meq/L (ref 3.5–5.1)
SODIUM: 136 meq/L (ref 135–145)
TOTAL PROTEIN: 7.7 g/dL (ref 6.0–8.3)
Total Bilirubin: 0.4 mg/dL (ref 0.2–1.2)

## 2016-02-05 LAB — HEMOGLOBIN A1C: Hgb A1c MFr Bld: 7 % — ABNORMAL HIGH (ref 4.6–6.5)

## 2016-02-05 LAB — LDL CHOLESTEROL, DIRECT: Direct LDL: 102 mg/dL

## 2016-02-05 MED ORDER — MOXIFLOXACIN HCL 400 MG PO TABS: 400.0000 mg | ORAL_TABLET | Freq: Every day | ORAL | Status: DC

## 2016-02-05 MED ORDER — ATOMOXETINE HCL 25 MG PO CAPS: 25.0000 mg | ORAL_CAPSULE | Freq: Every day | ORAL | Status: AC

## 2016-02-05 NOTE — Progress Notes (Signed)
Patient ID: Marcus Arias, male   DOB: 1976/10/30, 40 y.o.   MRN: [id number]  Marcus Rumps, MD Phone: [phone]  Marcus Arias is a 40 y.o. male who presents today for follow-up and to transfer care to our office.  Pneumonia: Patient notes he was evaluated in January and diagnosed with pneumonia on chest x-ray. He finished his azithromycin. He notes currently he feels fine for the most part. He notes he still has some cough. Some mucus buildup in his nose and sneezing. Also has postnasal drip. No shortness of breath. No chest pain. No fevers. He overall feels well.  DIABETES Disease Monitoring: Blood Sugar ranges-not checking Polyuria/phagia/dipsia- no      Not currently taking any medicine. Notes he was working on diet. He has maintained about 15 pounds of weight loss. He was doing a Research scientist (life sciences) diet.  ADD: Patient notes he has been out of his Strattera for about the last month. He had previously been on 25 mg though last fall they increased him to 40 mg and this upset his stomach. He had some nausea and decreased appetite with this. He notes 25 mg did well to control his ADD symptoms. He concentrated well with this. No palpitations.  HYPERLIPIDEMIA Symptoms Chest pain on exertion:  No   Leg claudication:   No Not currently taking any medications. Was previously on a statin and fenofibrate.  PMH: nonsmoker.   ROS see history of present illness  Objective  Physical Exam Filed Vitals:   02/05/16 0832  BP: 120/82  Pulse: 92  Temp: 98.4 F (36.9 C)    BP Readings from Last 3 Encounters:  02/05/16 120/82  12/12/15 120/80  08/25/15 126/78   Wt Readings from Last 3 Encounters:  02/05/16 246 lb 9.6 oz (111.857 kg)  12/12/15 252 lb 3.2 oz (114.397 kg)  08/25/15 254 lb 3.2 oz (115.304 kg)    Physical Exam  Constitutional: He is well-developed, well-nourished, and in no distress.  HENT:  Head: Normocephalic and atraumatic.  Right Ear: External ear normal.  Left Ear:  External ear normal.  Mouth/Throat: Oropharynx is clear and moist. No oropharyngeal exudate.  No posterior oropharyngeal erythema, normal TMs bilaterally  Eyes: Conjunctivae are normal. Pupils are equal, round, and reactive to light.  Neck: Neck supple.  Cardiovascular: Normal rate, regular rhythm and normal heart sounds.  Exam reveals no gallop and no friction rub.   No murmur heard. Pulmonary/Chest: Effort normal. No respiratory distress. He has no wheezes.  Coarse breath sounds bilateral lower lung fields left greater than right  Lymphadenopathy:    He has no cervical adenopathy.  Neurological: He is alert. Gait normal.  Skin: Skin is warm and dry. He is not diaphoretic.     Assessment/Plan: Please see individual problem list.  Attention deficit disorder Not currently on medications. We will restart Strattera at 25 mg daily. Patient will monitor for improvement in symptoms.  DM2 (diabetes mellitus, type 2) Previously diet controlled. Discussed diet and exercise. We will check an A1c today.  CAP (community acquired pneumonia) Previously treated for community-acquired pneumonia. He has had persistent cough. Also with upper respiratory symptoms recently. Lungs with coarse breath sounds in lower lung fields. Given persistent symptoms we will complete a chest x-ray to reevaluate this issue. If it shows pneumonia would favor treatment with antibiotic. If does not show pneumonia would favor treatment for bronchitis and upper respiratory symptoms with prednisone. We'll await the results of the chest x-ray and call the patient to discuss.  Given return precautions.  HYPERTRIGLYCERIDEMIA Not currently taking medications. We will check a lipid panel today.    Orders Placed This Encounter  Procedures  . DG Chest 2 View    Standing Status: Future     Number of Occurrences: 1     Standing Expiration Date: 04/06/2017    Order Specific Question:  Reason for Exam (SYMPTOM  OR DIAGNOSIS  REQUIRED)    Answer:  persistent cough, rhonchi s/p treatment for pneumonia    Order Specific Question:  Preferred imaging location?    Answer:  [facility]  . HgB A1c  . Lipid Profile  . Comp Met (CMET)    Meds ordered this encounter  Medications  . atomoxetine (STRATTERA) 25 MG capsule    Sig: Take 1 capsule (25 mg total) by mouth daily.    Dispense:  30 capsule    Refill:  [place], MD [place]

## 2016-02-05 NOTE — Assessment & Plan Note (Signed)
Previously diet controlled. Discussed diet and exercise. We will check an A1c today.

## 2016-02-05 NOTE — Patient Instructions (Signed)
Nice to meet you. Please go get the chest x-ray to evaluate her lungs after treatment for pneumonia. We will check lab work today to evaluate your triglycerides and diabetes. We'll restart her on Strattera. If you develop chest pain, shortness of breath, cough productive of blood, palpitations, fevers, or any new or changing symptoms

## 2016-02-05 NOTE — Assessment & Plan Note (Signed)
Previously treated for community-acquired pneumonia. He has had persistent cough. Also with upper respiratory symptoms recently. Lungs with coarse breath sounds in lower lung fields. Given persistent symptoms we will complete a chest x-ray to reevaluate this issue. If it shows pneumonia would favor treatment with antibiotic. If does not show pneumonia would favor treatment for bronchitis and upper respiratory symptoms with prednisone. We'll await the results of the chest x-ray and call the patient to discuss. Given return precautions.

## 2016-02-05 NOTE — Assessment & Plan Note (Signed)
Not currently taking medications. We will check a lipid panel today.

## 2016-02-05 NOTE — Assessment & Plan Note (Signed)
Not currently on medications. We will restart Strattera at 25 mg daily. Patient will monitor for improvement in symptoms.

## 2016-02-05 NOTE — Progress Notes (Signed)
Pre visit review using our clinic review tool, if applicable. No additional management support is needed unless otherwise documented below in the visit note. 

## 2016-02-07 ENCOUNTER — Other Ambulatory Visit: Payer: Self-pay | Admitting: Family Medicine

## 2016-02-07 MED ORDER — ATORVASTATIN CALCIUM 40 MG PO TABS: 40.0000 mg | ORAL_TABLET | Freq: Every day | ORAL | Status: DC

## 2016-02-07 MED ORDER — METFORMIN HCL 500 MG PO TABS: 500.0000 mg | ORAL_TABLET | Freq: Two times a day (BID) | ORAL | Status: DC

## 2016-02-16 ENCOUNTER — Other Ambulatory Visit: Payer: 59

## 2016-02-21 ENCOUNTER — Telehealth: Payer: Self-pay | Admitting: *Deleted

## 2016-02-21 DIAGNOSIS — R7989 Other specified abnormal findings of blood chemistry: Secondary | ICD-10-CM

## 2016-02-21 DIAGNOSIS — R945 Abnormal results of liver function studies: Principal | ICD-10-CM

## 2016-02-21 NOTE — Telephone Encounter (Signed)
Pt has lab appt to recheck LFT's. Need orders placed

## 2016-02-22 ENCOUNTER — Other Ambulatory Visit: Payer: 59

## 2016-02-22 ENCOUNTER — Other Ambulatory Visit (INDEPENDENT_AMBULATORY_CARE_PROVIDER_SITE_OTHER): Payer: 59

## 2016-02-22 DIAGNOSIS — R945 Abnormal results of liver function studies: Principal | ICD-10-CM

## 2016-02-22 DIAGNOSIS — R7989 Other specified abnormal findings of blood chemistry: Secondary | ICD-10-CM | POA: Diagnosis not present

## 2016-02-22 LAB — HEPATIC FUNCTION PANEL
ALT: 65 U/L — AB (ref 0–53)
AST: 40 U/L — ABNORMAL HIGH (ref 0–37)
Albumin: 4.4 g/dL (ref 3.5–5.2)
Alkaline Phosphatase: 86 U/L (ref 39–117)
Bilirubin, Direct: 0.1 mg/dL (ref 0.0–0.3)
TOTAL PROTEIN: 7.8 g/dL (ref 6.0–8.3)
Total Bilirubin: 0.5 mg/dL (ref 0.2–1.2)

## 2016-02-28 ENCOUNTER — Ambulatory Visit: Payer: 59 | Admitting: Nurse Practitioner

## 2016-03-08 ENCOUNTER — Encounter: Payer: Self-pay | Admitting: Family Medicine

## 2016-03-08 ENCOUNTER — Ambulatory Visit (INDEPENDENT_AMBULATORY_CARE_PROVIDER_SITE_OTHER): Payer: 59 | Admitting: Family Medicine

## 2016-03-08 VITALS — BP 120/84 | HR 85 | Temp 98.2°F | Ht 70.0 in | Wt 250.0 lb

## 2016-03-08 DIAGNOSIS — J189 Pneumonia, unspecified organism: Secondary | ICD-10-CM | POA: Diagnosis not present

## 2016-03-08 DIAGNOSIS — B353 Tinea pedis: Secondary | ICD-10-CM

## 2016-03-08 DIAGNOSIS — E781 Pure hyperglyceridemia: Secondary | ICD-10-CM | POA: Diagnosis not present

## 2016-03-08 DIAGNOSIS — E119 Type 2 diabetes mellitus without complications: Secondary | ICD-10-CM

## 2016-03-08 DIAGNOSIS — J301 Allergic rhinitis due to pollen: Secondary | ICD-10-CM | POA: Diagnosis not present

## 2016-03-08 LAB — MICROALBUMIN / CREATININE URINE RATIO
Creatinine,U: 207.7 mg/dL
Microalb Creat Ratio: 1.3 mg/g (ref 0.0–30.0)
Microalb, Ur: 2.7 mg/dL — ABNORMAL HIGH (ref 0.0–1.9)

## 2016-03-08 MED ORDER — ATORVASTATIN CALCIUM 40 MG PO TABS: 40.0000 mg | ORAL_TABLET | Freq: Every day | ORAL | Status: DC

## 2016-03-08 NOTE — Progress Notes (Signed)
Patient ID: Marcus Arias, male   DOB: [birth year], 40 y.o.   MRN: [id number]  Marikay AlarEric Sonnenberg, MD Phone: [phone]  Marcus Arias is a 40 y.o. male who presents today for follow-up.  Pneumonia: Patient notes this is resolved. Notes no wheezing or shortness of breath. No chest pain. He finished antibiotics. He does note some mild postnasal drip and nasal congestion in the mornings. Has not taken any medicine for that.  DIABETES Disease Monitoring: Blood Sugar ranges-not checking Polyuria/phagia/dipsia- no      ophthalmology- has not seen recently Medications: Compliance- some days takes his metformin once a day. Hypoglycemic symptoms- occasionally states he feels cranky and this improves with eating a candy bar.  Hyperlipidemia: Patient has not started on a statin. No chest pain or shortness of breath or muscle aches.  Patient reports he has started to work with the dietitian regarding his diet. He is trying to plan meals and doing meal replacement bars.  He also reports history of athlete's foot. No itching. Notes flaky skin on his feet. Has not treated this with over-the-counter medications. No sores or ulcers on his feet. No pain in his feet. No numbness in his feet.  PMH: nonsmoker.   ROS see history of present illness  Objective  Physical Exam Filed Vitals:   03/08/16 0907  BP: 120/84  Pulse: 85  Temp: 98.2 F (36.8 C)    BP Readings from Last 3 Encounters:  03/08/16 120/84  02/05/16 120/82  12/12/15 120/80   Wt Readings from Last 3 Encounters:  03/08/16 250 lb (113.399 kg)  02/05/16 246 lb 9.6 oz (111.857 kg)  12/12/15 252 lb 3.2 oz (114.397 kg)    Physical Exam  Constitutional: He is well-developed, well-nourished, and in no distress.  HENT:  Head: Normocephalic and atraumatic.  Cardiovascular: Normal rate, regular rhythm and normal heart sounds.   Pulmonary/Chest: Effort normal and breath sounds normal.  Neurological: He is alert. Gait normal.  Skin:  He is not diaphoretic.  Bilateral feet with minimally flaky skin around the heels and somewhat between his toes, 2+ DP and PT pulses, no ulcerations or wounds, sensation to light touch and monofilament intact     Assessment/Plan: Please see individual problem list.  CAP (community acquired pneumonia) Symptoms resolved. Benign lung exam today. Stable vital signs. He'll continue to monitor.  ALLERGIC RHINITIS, SEASONAL Suspect postnasal drip and nasal congestion and morning is likely related to allergies. Discussed Flonase and Claritin over-the-counter.  DM2 (diabetes mellitus, type 2) Last A1c 7.0. Has not been consistent with metformin. Discussed strategies to remind himself to take this daily. He will increase to twice a day. He'll continue to work with the nutritionist. Foot exam completed today. Urine microalbumin collected. Ophthalmology referral placed for diabetic eye exam as well.  HYPERTRIGLYCERIDEMIA Has not started on Lipitor. This was resent to his pharmacy.  Athlete's foot Dry flaky skin may or may not be related athlete's foot versus just having dry or flaky skin. Discussed over-the-counter Lamisil trial. He'll monitor this. Given return precautions.    Orders Placed This Encounter  Procedures  . Urine Microalbumin w/creat. ratio  . Ambulatory referral to Ophthalmology    Referral Priority:  Routine    Referral Type:  Consultation    Referral Reason:  Specialty Services Required    Requested Specialty:  Ophthalmology    Number of Visits Requested:  1    Meds ordered this encounter  Medications  . atorvastatin (LIPITOR) 40 MG tablet  Sig: Take 1 tablet (40 mg total) by mouth daily.    Dispense:  90 tablet    Refill:  3    Marikay Alar, MD [facility]

## 2016-03-08 NOTE — Assessment & Plan Note (Signed)
Dry flaky skin may or may not be related athlete's foot versus just having dry or flaky skin. Discussed over-the-counter Lamisil trial. He'll monitor this. Given return precautions.

## 2016-03-08 NOTE — Assessment & Plan Note (Signed)
Symptoms resolved. Benign lung exam today. Stable vital signs. He'll continue to monitor.

## 2016-03-08 NOTE — Assessment & Plan Note (Signed)
Has not started on Lipitor. This was resent to his pharmacy.

## 2016-03-08 NOTE — Patient Instructions (Signed)
Nice to see you. Please pick up the Lipitor at the pharmacy. You can take Claritin and Flonase over-the-counter for your postnasal drip. Please increase her metformin to 500 mg twice daily. Please pick up over-the-counter Lamisil for your feet. I will see you back in 2 months.

## 2016-03-08 NOTE — Assessment & Plan Note (Addendum)
Last A1c 7.0. Has not been consistent with metformin. Discussed strategies to remind himself to take this daily. He will increase to twice a day. He'll continue to work with the nutritionist. Foot exam completed today. Urine microalbumin collected. Ophthalmology referral placed for diabetic eye exam as well.

## 2016-03-08 NOTE — Assessment & Plan Note (Signed)
Suspect postnasal drip and nasal congestion and morning is likely related to allergies. Discussed Flonase and Claritin over-the-counter.

## 2016-03-08 NOTE — Progress Notes (Signed)
Pre visit review using our clinic review tool, if applicable. No additional management support is needed unless otherwise documented below in the visit note. 

## 2016-03-15 LAB — HM DIABETES EYE EXAM

## 2016-03-19 ENCOUNTER — Encounter: Payer: Self-pay | Admitting: Surgical

## 2016-03-20 ENCOUNTER — Encounter: Payer: Self-pay | Admitting: Surgical

## 2016-04-22 ENCOUNTER — Telehealth: Payer: Self-pay | Admitting: Family Medicine

## 2016-04-22 NOTE — Telephone Encounter (Signed)
Forms on your desk.

## 2016-04-22 NOTE — Telephone Encounter (Signed)
Pt dropped off a Medical Evaluation form to be filled out.Marland Kitchen. Please advise pt when ready.. Placed in Dr. Kermit BaloSonnenbergs box

## 2016-04-30 NOTE — Telephone Encounter (Signed)
[facility]

## 2016-04-30 NOTE — Telephone Encounter (Signed)
Patient notified and forms put up front

## 2016-05-10 ENCOUNTER — Ambulatory Visit: Payer: 59 | Admitting: Family Medicine

## 2016-05-29 ENCOUNTER — Ambulatory Visit: Payer: 59 | Admitting: Family Medicine

## 2016-07-12 ENCOUNTER — Ambulatory Visit: Payer: 59 | Admitting: Family Medicine

## 2016-07-19 ENCOUNTER — Encounter: Payer: Self-pay | Admitting: Family Medicine

## 2016-07-19 ENCOUNTER — Ambulatory Visit (INDEPENDENT_AMBULATORY_CARE_PROVIDER_SITE_OTHER): Payer: 59 | Admitting: Family Medicine

## 2016-07-19 VITALS — BP 106/72 | HR 84 | Temp 98.4°F | Wt 250.6 lb

## 2016-07-19 DIAGNOSIS — E781 Pure hyperglyceridemia: Secondary | ICD-10-CM

## 2016-07-19 DIAGNOSIS — R600 Localized edema: Secondary | ICD-10-CM | POA: Insufficient documentation

## 2016-07-19 DIAGNOSIS — E119 Type 2 diabetes mellitus without complications: Secondary | ICD-10-CM | POA: Diagnosis not present

## 2016-07-19 LAB — CBC
HCT: 45.8 % (ref 39.0–52.0)
Hemoglobin: 15.8 g/dL (ref 13.0–17.0)
MCHC: 34.4 g/dL (ref 30.0–36.0)
MCV: 86.3 fl (ref 78.0–100.0)
PLATELETS: 208 10*3/uL (ref 150.0–400.0)
RBC: 5.31 Mil/uL (ref 4.22–5.81)
RDW: 12.7 % (ref 11.5–15.5)
WBC: 6.1 10*3/uL (ref 4.0–10.5)

## 2016-07-19 LAB — COMPREHENSIVE METABOLIC PANEL
ALBUMIN: 4.4 g/dL (ref 3.5–5.2)
ALK PHOS: 96 U/L (ref 39–117)
ALT: 67 U/L — AB (ref 0–53)
AST: 38 U/L — ABNORMAL HIGH (ref 0–37)
BILIRUBIN TOTAL: 0.4 mg/dL (ref 0.2–1.2)
BUN: 15 mg/dL (ref 6–23)
CO2: 27 mEq/L (ref 19–32)
Calcium: 8.9 mg/dL (ref 8.4–10.5)
Chloride: 104 mEq/L (ref 96–112)
Creatinine, Ser: 0.86 mg/dL (ref 0.40–1.50)
GFR: 105.8 mL/min (ref >60.00)
GLUCOSE: 179 mg/dL — AB (ref 70–99)
Potassium: 4 mEq/L (ref 3.5–5.1)
Sodium: 136 mEq/L (ref 135–145)
TOTAL PROTEIN: 7.6 g/dL (ref 6.0–8.3)

## 2016-07-19 LAB — HEMOGLOBIN A1C: HEMOGLOBIN A1C: 7.3 % — AB (ref 4.6–6.5)

## 2016-07-19 LAB — TSH: TSH: 1.79 u[IU]/mL (ref 0.35–4.50)

## 2016-07-19 NOTE — Patient Instructions (Signed)
Nice to see you. I would encourage you to take the metformin and Lipitor. We'll check some lab work to evaluate your swelling. If you develop chest pain or shortness of breath or increased swelling please take medical attention.

## 2016-07-19 NOTE — Assessment & Plan Note (Signed)
I suspect this and the rash were related to him being on his feet more than usual and walking more than usual. No CHF symptoms. We will check lab work rule out other causes. He'll monitor for recurrence.

## 2016-07-19 NOTE — Assessment & Plan Note (Signed)
Not taking metformin consistently. I encouraged him to take this. We will check an A1c today.

## 2016-07-19 NOTE — Progress Notes (Signed)
  Tommi Rumps, MD Phone: [phone]  Marcus Arias is a 40 y.o. male who presents today for follow-up.  DIABETES Disease Monitoring: Blood Sugar ranges-not checking Polyuria/phagia/dipsia- no      ophthalmology- up-to-date Medications: Compliance- not taking metformin very often  Patient notes he has been working on his diet and trying to get more protein. He notes mostly lean meats and vegetables when at home. Does eat out a fair amount though.  HYPERLIPIDEMIA Symptoms Chest pain on exertion:  No   Leg claudication:   No Medications: Compliance- not taking Lipitor. Triglycerides were quite elevated last time though still less than 500. LDL well controlled.   Lower extremity swelling: Patient notes he was at [facility] about a week ago and had bilateral ankle swelling. He noted medial ankle rash that appeared to be possible petechiae based on cell phone picture. Rash has improved at this time. Minimal swelling now. No orthopnea or PND. No chest pain or shortness of breath. He was walking about 20,000 steps a day while there.   PMH: nonsmoker.   ROS see history of present illness  Objective  Physical Exam Vitals:   07/19/16 0926  BP: 106/72  Pulse: 84  Temp: 98.4 F (36.9 C)    BP Readings from Last 3 Encounters:  07/19/16 106/72  03/08/16 120/84  02/05/16 120/82   Wt Readings from Last 3 Encounters:  07/19/16 250 lb 9.6 oz (113.7 kg)  03/08/16 250 lb (113.4 kg)  02/05/16 246 lb 9.6 oz (111.9 kg)    Physical Exam  Constitutional: No distress.  HENT:  Head: Normocephalic.  Cardiovascular: Normal rate, regular rhythm and normal heart sounds.   Pulmonary/Chest: Effort normal and breath sounds normal.  Musculoskeletal: He exhibits no edema.  Neurological: He is alert. Gait normal.  Skin: He is not diaphoretic.  Medial aspects just above the ankles bilaterally with several small dark splotches though no raised rash or petechiae     Assessment/Plan:  Please see individual problem list.  DM2 (diabetes mellitus, type 2) Not taking metformin consistently. I encouraged him to take this. We will check an A1c today.  HYPERTRIGLYCERIDEMIA Has not been taking Lipitor. Triglycerides elevated. LDL normal. Encouraged him to take Lipitor to help with his triglycerides. Also encouraged to continue dietary changes and to start exercising as this will benefit his triglycerides as well.  Bilateral lower extremity edema I suspect this and the rash were related to him being on his feet more than usual and walking more than usual. No CHF symptoms. We will check lab work rule out other causes. He'll monitor for recurrence.   Orders Placed This Encounter  Procedures  . Comp Met (CMET)  . CBC  . HgB A1c  . TSH    Tommi Rumps, MD [place]

## 2016-07-19 NOTE — Progress Notes (Signed)
Pre visit review using our clinic review tool, if applicable. No additional management support is needed unless otherwise documented below in the visit note. 

## 2016-07-19 NOTE — Assessment & Plan Note (Signed)
Has not been taking Lipitor. Triglycerides elevated. LDL normal. Encouraged him to take Lipitor to help with his triglycerides. Also encouraged to continue dietary changes and to start exercising as this will benefit his triglycerides as well.

## 2016-07-23 ENCOUNTER — Other Ambulatory Visit: Payer: Self-pay | Admitting: Family Medicine

## 2016-07-23 MED ORDER — ATORVASTATIN CALCIUM 40 MG PO TABS: 40.0000 mg | ORAL_TABLET | Freq: Every day | ORAL | 3 refills | Status: AC

## 2016-08-31 ENCOUNTER — Other Ambulatory Visit: Payer: Self-pay | Admitting: Family Medicine

## 2016-09-02 NOTE — Telephone Encounter (Signed)
Please advise on refill.

## 2016-09-02 NOTE — Telephone Encounter (Signed)
It appears that the last time I refilled this was in March. Please see if he has been taking this. Thanks.

## 2016-09-03 NOTE — Telephone Encounter (Signed)
LMTRC

## 2016-09-04 ENCOUNTER — Telehealth: Payer: Self-pay | Admitting: *Deleted

## 2016-09-04 NOTE — Telephone Encounter (Signed)
I refused medication in refill box since patient does not need

## 2016-09-04 NOTE — Telephone Encounter (Signed)
FYI Pt stated that he did not need a refill on the atomoxetine. He stated that he do not take this medication daily, so he has plenty left.

## 2016-09-04 NOTE — Telephone Encounter (Signed)
Noted  

## 2016-10-18 ENCOUNTER — Ambulatory Visit: Payer: 59 | Admitting: Family Medicine

## 2017-02-12 ENCOUNTER — Other Ambulatory Visit: Payer: Self-pay | Admitting: Family Medicine

## 2017-02-12 NOTE — Telephone Encounter (Signed)
90 day supply sent to pharmacy. Needs follow-up scheduled.

## 2017-02-12 NOTE — Telephone Encounter (Signed)
Last OV 07/19/16, no follow up scheduled, last filled 02/07/16 180 3rf

## 2017-05-10 ENCOUNTER — Other Ambulatory Visit: Payer: Self-pay | Admitting: Family Medicine

## 2017-09-02 IMAGING — DX DG CHEST 2V
2 series · 2 of 2 positions shown · non-contrast
Comparison: No prior.

CLINICAL DATA: Cough and wheezing.

EXAM:
CHEST  2 VIEW

[chest pa]
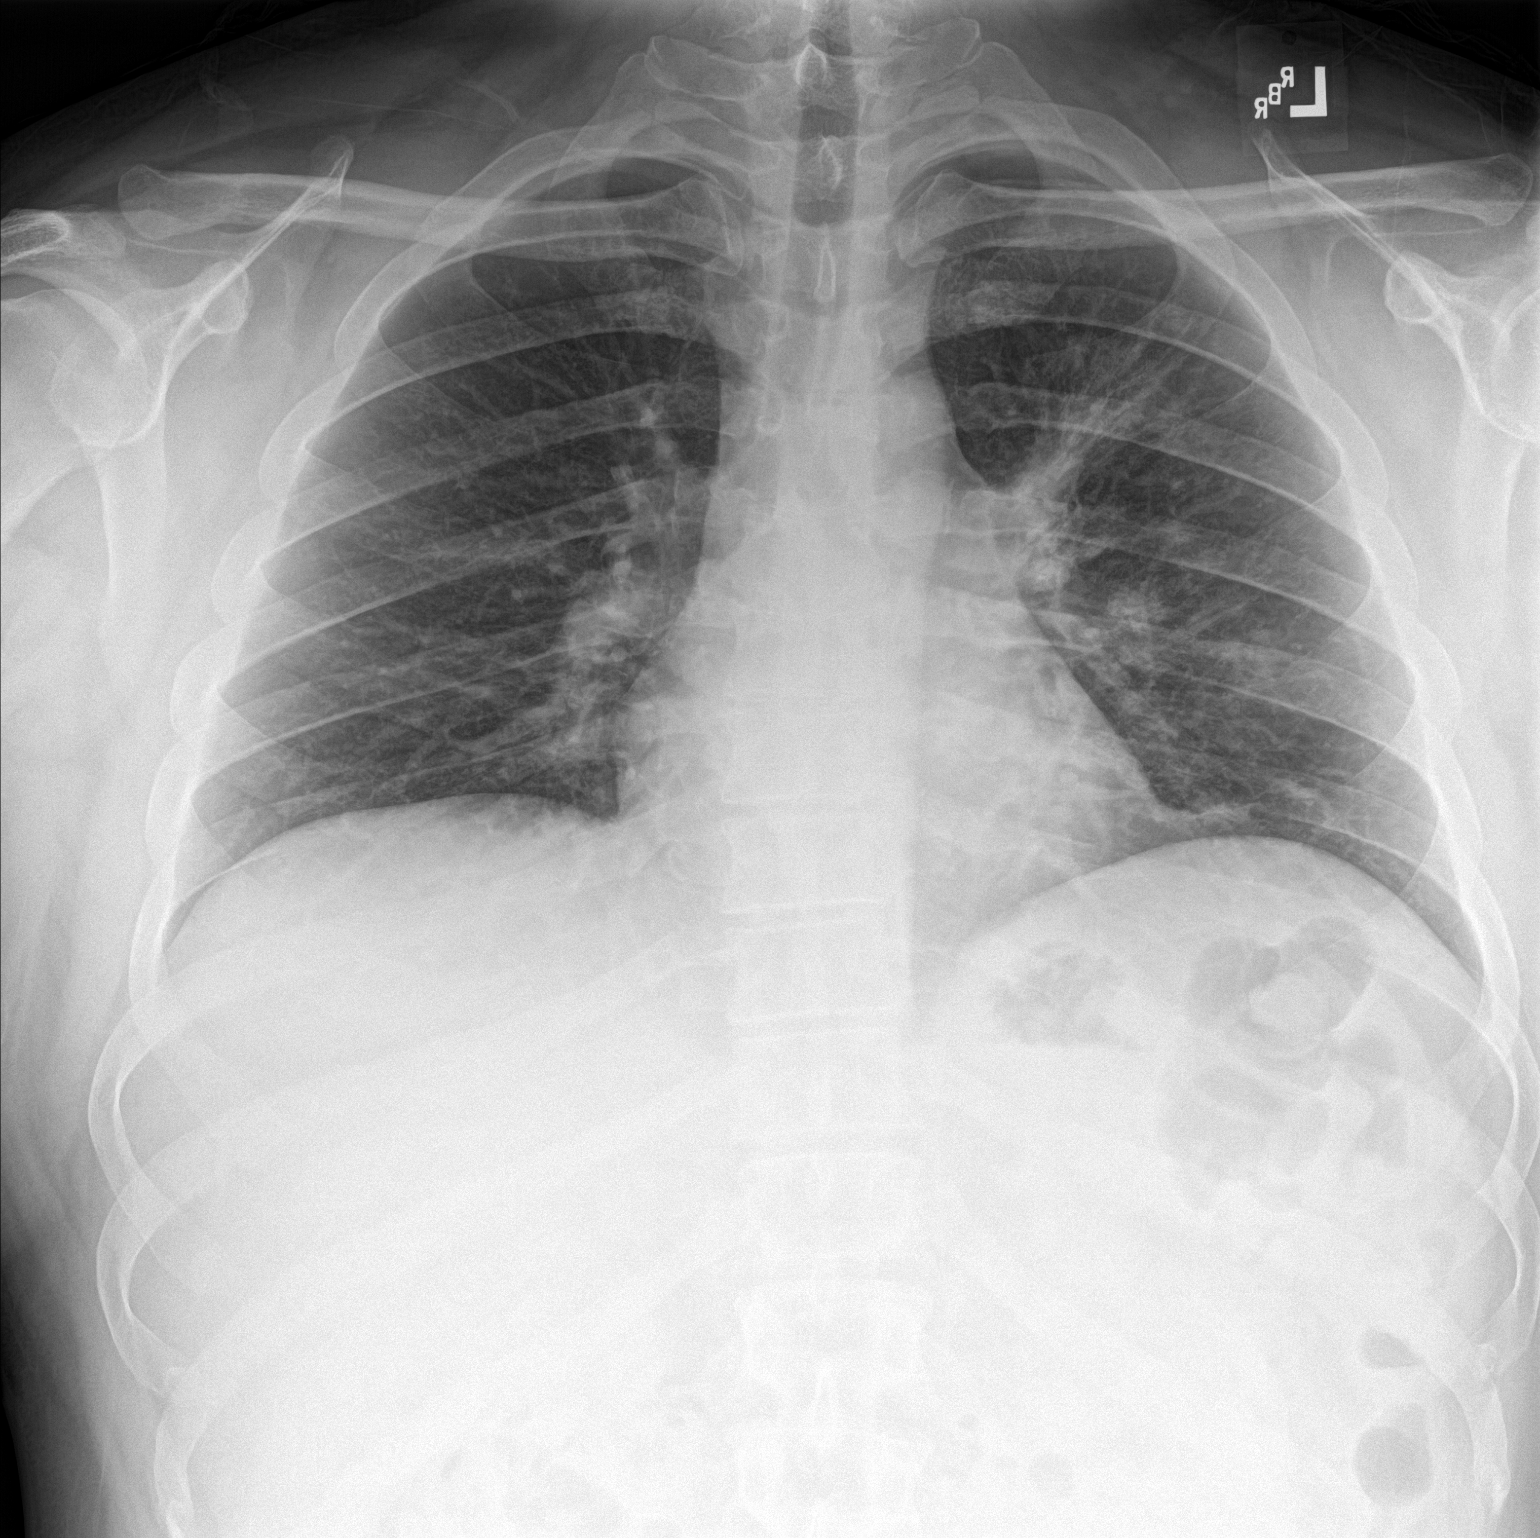

[chest lat]
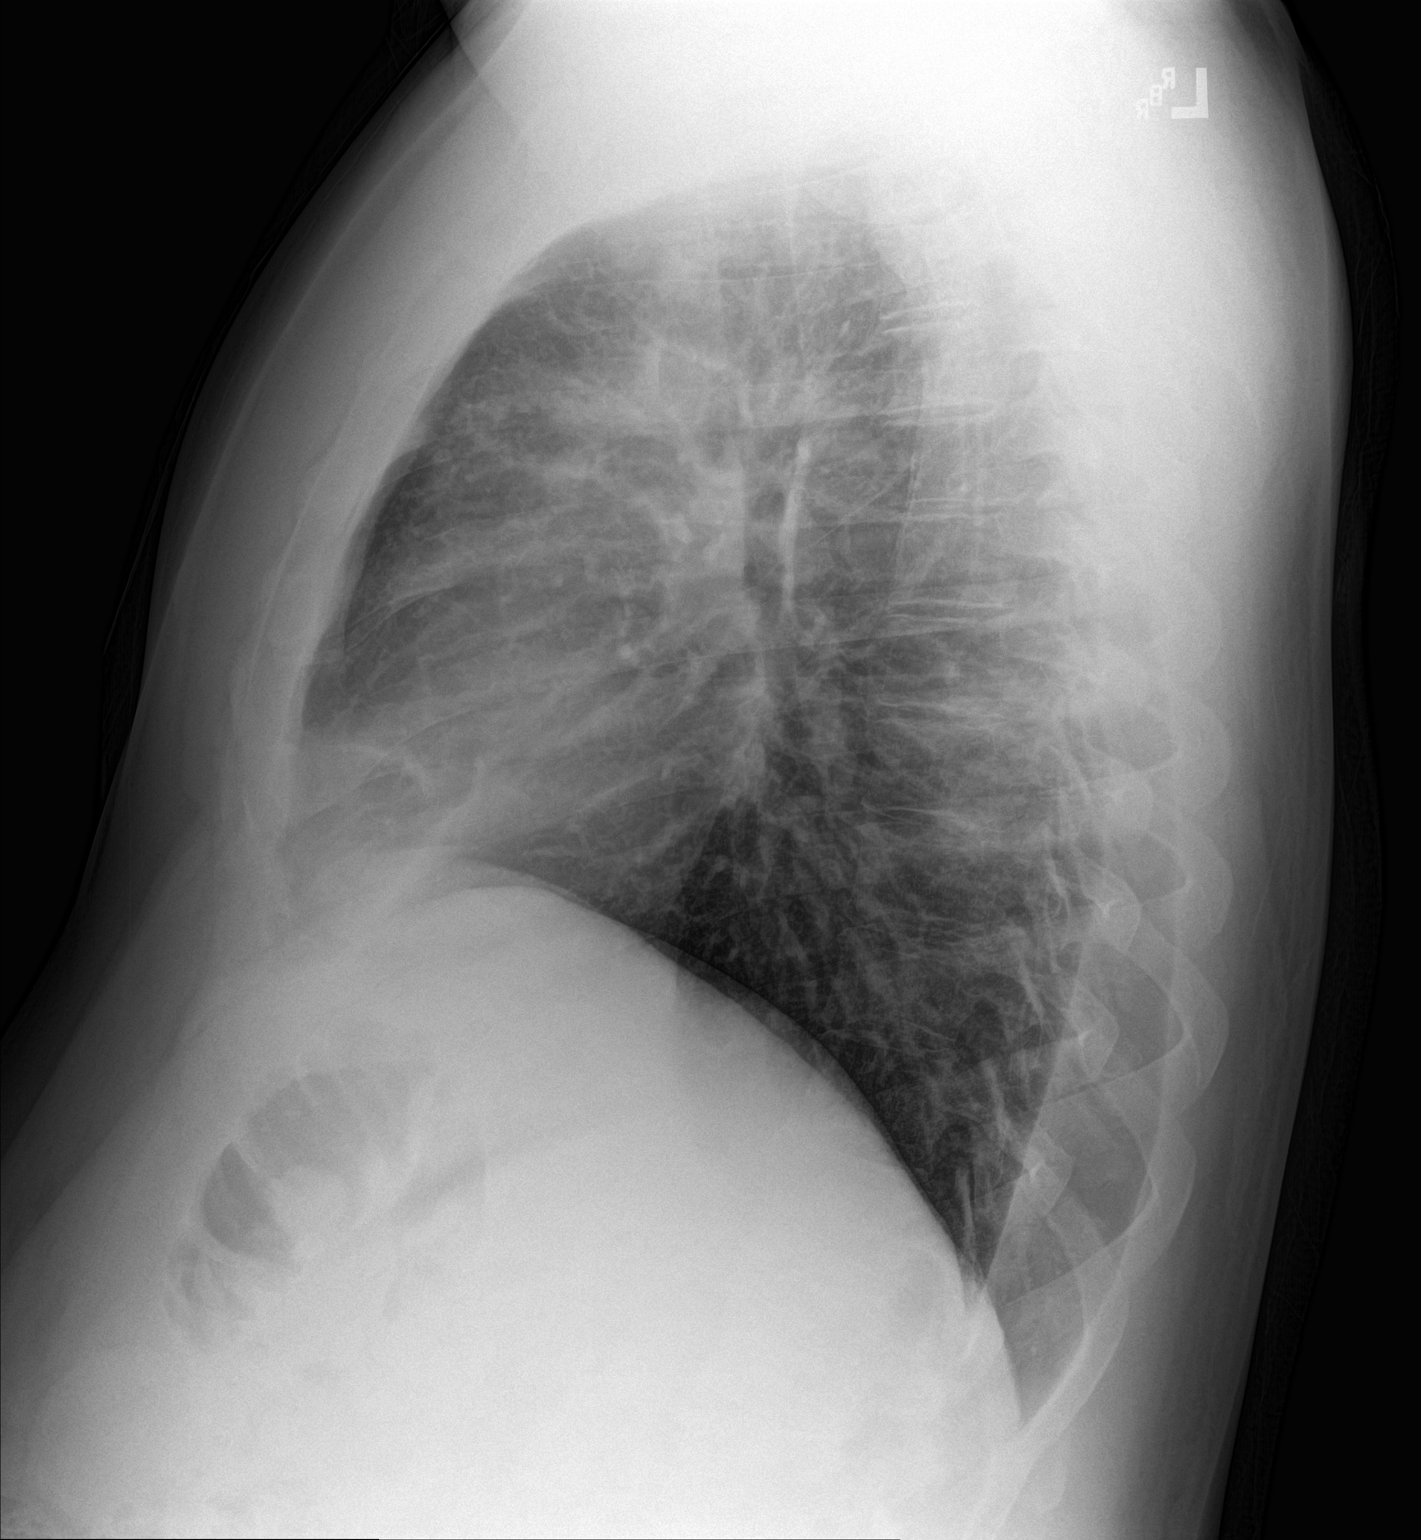

[2 of 2 positions shown; findings below may reference images not displayed]

FINDINGS: Mediastinum hilar structures normal. Left upper lobe and left base
infiltrates are noted. Close follow-up chest x-rays recommended to
demonstrate clearing. No pleural effusion or pneumothorax. Heart
size normal. No acute bony abnormality.
IMPRESSION: Left upper lobe and left base infiltrates most consistent with
pneumonia. Close follow-up chest x-rays recommended to demonstrate
clearing.
